# Patient Record
Sex: Female | Born: 1970 | Hispanic: Yes | Marital: Single | Smoking: Never smoker
Health system: Southern US, Community
[De-identification: ages and names within clinical notes are randomized; demographics above are authoritative.]

## PROBLEM LIST (undated history)

## (undated) DIAGNOSIS — J45909 Unspecified asthma, uncomplicated: Secondary | ICD-10-CM

## (undated) DIAGNOSIS — G43909 Migraine, unspecified, not intractable, without status migrainosus: Secondary | ICD-10-CM

## (undated) DIAGNOSIS — N390 Urinary tract infection, site not specified: Secondary | ICD-10-CM

## (undated) DIAGNOSIS — T7840XA Allergy, unspecified, initial encounter: Secondary | ICD-10-CM

## (undated) DIAGNOSIS — R519 Headache, unspecified: Secondary | ICD-10-CM

## (undated) DIAGNOSIS — F32A Depression, unspecified: Secondary | ICD-10-CM

## (undated) DIAGNOSIS — Z8619 Personal history of other infectious and parasitic diseases: Secondary | ICD-10-CM

## (undated) DIAGNOSIS — R51 Headache: Secondary | ICD-10-CM

## (undated) DIAGNOSIS — F329 Major depressive disorder, single episode, unspecified: Secondary | ICD-10-CM

## (undated) HISTORY — DX: Depression, unspecified: F32.A

## (undated) HISTORY — DX: Headache, unspecified: R51.9

## (undated) HISTORY — DX: Headache: R51

## (undated) HISTORY — DX: Urinary tract infection, site not specified: N39.0

## (undated) HISTORY — DX: Unspecified asthma, uncomplicated: J45.909

## (undated) HISTORY — DX: Major depressive disorder, single episode, unspecified: F32.9

## (undated) HISTORY — DX: Allergy, unspecified, initial encounter: T78.40XA

## (undated) HISTORY — DX: Migraine, unspecified, not intractable, without status migrainosus: G43.909

## (undated) HISTORY — DX: Personal history of other infectious and parasitic diseases: Z86.19

---

## 1990-11-29 HISTORY — PX: WISDOM TOOTH EXTRACTION: SHX21

## 2005-09-29 ENCOUNTER — Encounter: Admission: RE | Admit: 2005-09-29 | Discharge: 2005-09-29 | Payer: Self-pay | Admitting: Family Medicine

## 2007-03-31 ENCOUNTER — Encounter: Admission: RE | Admit: 2007-03-31 | Discharge: 2007-03-31 | Payer: Self-pay | Admitting: Obstetrics & Gynecology

## 2012-03-01 ENCOUNTER — Other Ambulatory Visit: Payer: Self-pay | Admitting: *Deleted

## 2012-03-01 DIAGNOSIS — Z1231 Encounter for screening mammogram for malignant neoplasm of breast: Secondary | ICD-10-CM

## 2012-03-06 ENCOUNTER — Ambulatory Visit
Admission: RE | Admit: 2012-03-06 | Discharge: 2012-03-06 | Disposition: A | Discharge status: Home / Self Care | Payer: BC Managed Care – PPO | Source: Ambulatory Visit | Attending: *Deleted | Admitting: *Deleted

## 2012-03-06 DIAGNOSIS — Z1231 Encounter for screening mammogram for malignant neoplasm of breast: Secondary | ICD-10-CM

## 2013-05-18 ENCOUNTER — Other Ambulatory Visit: Payer: Self-pay

## 2013-05-18 DIAGNOSIS — Z1231 Encounter for screening mammogram for malignant neoplasm of breast: Secondary | ICD-10-CM

## 2013-05-23 ENCOUNTER — Ambulatory Visit
Admission: RE | Admit: 2013-05-23 | Discharge: 2013-05-23 | Disposition: A | Discharge status: Home / Self Care | Payer: BC Managed Care – PPO | Source: Ambulatory Visit

## 2013-05-23 DIAGNOSIS — Z1231 Encounter for screening mammogram for malignant neoplasm of breast: Secondary | ICD-10-CM

## 2014-05-07 ENCOUNTER — Other Ambulatory Visit: Payer: Self-pay

## 2014-05-07 DIAGNOSIS — Z1231 Encounter for screening mammogram for malignant neoplasm of breast: Secondary | ICD-10-CM

## 2014-05-28 ENCOUNTER — Ambulatory Visit
Admission: RE | Admit: 2014-05-28 | Discharge: 2014-05-28 | Disposition: A | Discharge status: Home / Self Care | Payer: BC Managed Care – PPO | Source: Ambulatory Visit

## 2014-05-28 ENCOUNTER — Encounter (INDEPENDENT_AMBULATORY_CARE_PROVIDER_SITE_OTHER): Payer: Self-pay

## 2014-05-28 DIAGNOSIS — Z1231 Encounter for screening mammogram for malignant neoplasm of breast: Secondary | ICD-10-CM

## 2015-05-16 ENCOUNTER — Other Ambulatory Visit: Payer: Self-pay

## 2015-05-16 DIAGNOSIS — Z1231 Encounter for screening mammogram for malignant neoplasm of breast: Secondary | ICD-10-CM

## 2015-06-03 ENCOUNTER — Ambulatory Visit: Payer: BC Managed Care – PPO

## 2015-06-03 ENCOUNTER — Ambulatory Visit
Admission: RE | Admit: 2015-06-03 | Discharge: 2015-06-03 | Disposition: A | Discharge status: Home / Self Care | Payer: BLUE CROSS/BLUE SHIELD | Source: Ambulatory Visit

## 2015-06-03 DIAGNOSIS — Z1231 Encounter for screening mammogram for malignant neoplasm of breast: Secondary | ICD-10-CM

## 2015-09-22 ENCOUNTER — Other Ambulatory Visit (INDEPENDENT_AMBULATORY_CARE_PROVIDER_SITE_OTHER): Payer: 59

## 2015-09-22 ENCOUNTER — Ambulatory Visit (INDEPENDENT_AMBULATORY_CARE_PROVIDER_SITE_OTHER): Payer: 59 | Admitting: Internal Medicine

## 2015-09-22 ENCOUNTER — Encounter: Payer: Self-pay | Admitting: Internal Medicine

## 2015-09-22 VITALS — BP 120/80 | HR 82 | Temp 98.3°F | Resp 16 | Ht 65.5 in | Wt 183.0 lb

## 2015-09-22 DIAGNOSIS — J4520 Mild intermittent asthma, uncomplicated: Secondary | ICD-10-CM | POA: Diagnosis not present

## 2015-09-22 DIAGNOSIS — J45909 Unspecified asthma, uncomplicated: Secondary | ICD-10-CM | POA: Insufficient documentation

## 2015-09-22 DIAGNOSIS — Z0000 Encounter for general adult medical examination without abnormal findings: Secondary | ICD-10-CM | POA: Diagnosis not present

## 2015-09-22 LAB — LIPID PANEL
CHOL/HDL RATIO: 5
Cholesterol: 185 mg/dL (ref 0–200)
HDL: 38.8 mg/dL — ABNORMAL LOW (ref >39.00)
LDL CALC: 112 mg/dL — AB (ref 0–99)
NonHDL: 145.88
Triglycerides: 168 mg/dL — ABNORMAL HIGH (ref 0.0–149.0)
VLDL: 33.6 mg/dL (ref 0.0–40.0)

## 2015-09-22 LAB — COMPREHENSIVE METABOLIC PANEL
ALT: 18 U/L (ref 0–35)
AST: 11 U/L (ref 0–37)
Albumin: 4.2 g/dL (ref 3.5–5.2)
Alkaline Phosphatase: 78 U/L (ref 39–117)
BUN: 20 mg/dL (ref 6–23)
CO2: 27 mEq/L (ref 19–32)
CREATININE: 1.04 mg/dL (ref 0.40–1.20)
Calcium: 9.3 mg/dL (ref 8.4–10.5)
Chloride: 105 mEq/L (ref 96–112)
GFR: 61.14 mL/min (ref >60.00)
GLUCOSE: 107 mg/dL — AB (ref 70–99)
Potassium: 4.4 mEq/L (ref 3.5–5.1)
Sodium: 139 mEq/L (ref 135–145)
Total Bilirubin: 0.3 mg/dL (ref 0.2–1.2)
Total Protein: 7.4 g/dL (ref 6.0–8.3)

## 2015-09-22 LAB — HEMOGLOBIN A1C: HEMOGLOBIN A1C: 5.6 % (ref 4.6–6.5)

## 2015-09-22 MED ORDER — GUAIFENESIN 200 MG/5ML PO LIQD: 5.0000 mL | Freq: Four times a day (QID) | ORAL | Status: DC | PRN

## 2015-09-22 MED ORDER — FLUTICASONE-SALMETEROL 100-50 MCG/DOSE IN AEPB: 1.0000 | INHALATION_SPRAY | Freq: Two times a day (BID) | RESPIRATORY_TRACT | Status: DC

## 2015-09-22 MED ORDER — ALBUTEROL SULFATE HFA 108 (90 BASE) MCG/ACT IN AERS: 1.0000 | INHALATION_SPRAY | Freq: Four times a day (QID) | RESPIRATORY_TRACT | Status: DC | PRN

## 2015-09-22 NOTE — Progress Notes (Signed)
Pre visit review using our clinic review tool, if applicable. No additional management support is needed unless otherwise documented below in the visit note. 

## 2015-09-22 NOTE — Progress Notes (Signed)
   Subjective:    Patient ID: Amy Romero, female    DOB: [birth year], 44 y.o.   MRN: [id number]  HPI The patient is a new 44 YO female coming in for asthma. She has not had any flares in the last year. She does not use her medication appropriately and sometimes takes her advair daily or a couple times per week to stretch out her medication. She denies ever intubation or prednisone course in the last 5 years. Worse in the winter and allergy season. She does not take anything for allergies.   [facility], social history reviewed and updated.   Review of Systems  Constitutional: Negative for fever, activity change, appetite change, fatigue and unexpected weight change.  HENT: Negative.   Eyes: Negative.   Respiratory: Negative for cough, chest tightness, shortness of breath and wheezing.   Cardiovascular: Negative for chest pain, palpitations and leg swelling.  Gastrointestinal: Negative for nausea, abdominal pain, diarrhea, constipation and abdominal distention.  Musculoskeletal: Negative.   Skin: Negative.   Neurological: Negative.   Psychiatric/Behavioral: Negative.       Objective:   Physical Exam  Constitutional: She is oriented to person, place, and time. She appears well-developed and well-nourished.  HENT:  Head: Normocephalic and atraumatic.  Eyes: EOM are normal.  Neck: Normal range of motion.  Cardiovascular: Normal rate and regular rhythm.   Pulmonary/Chest: Effort normal and breath sounds normal. No respiratory distress. She has no wheezes. She has no rales.  Abdominal: Soft. She exhibits no distension. There is no tenderness. There is no rebound.  Musculoskeletal: She exhibits no edema.  Neurological: She is alert and oriented to person, place, and time. Coordination normal.  Skin: Skin is warm and dry.  Psychiatric: She has a normal mood and affect.   Filed Vitals:   09/22/15 0915  BP: 120/80  Pulse: 82  Temp: 98.3 F (36.8 C)  TempSrc: Oral  Resp: 16    Height: 5' 5.5" (1.664 m)  Weight: 183 lb (83.008 kg)  SpO2: 98%      Assessment & Plan:

## 2015-09-22 NOTE — Patient Instructions (Signed)
We will check the blood work today and have sent in your refills.   Come back in about 1 year or sooner if you have new problems or questions.   Health Maintenance, Female Adopting a healthy lifestyle and getting preventive care can go a long way to promote health and wellness. Talk with your health care provider about what schedule of regular examinations is right for you. This is a good chance for you to check in with your provider about disease prevention and staying healthy. In between checkups, there are plenty of things you can do on your own. Experts have done a lot of research about which lifestyle changes and preventive measures are most likely to keep you healthy. Ask your health care provider for more information. WEIGHT AND DIET  Eat a healthy diet  Be sure to include plenty of vegetables, fruits, low-fat dairy products, and lean protein.  Do not eat a lot of foods high in solid fats, added sugars, or salt.  Get regular exercise. This is one of the most important things you can do for your health.  Most adults should exercise for at least 150 minutes each week. The exercise should increase your heart rate and make you sweat (moderate-intensity exercise).  Most adults should also do strengthening exercises at least twice a week. This is in addition to the moderate-intensity exercise.  Maintain a healthy weight  Body mass index (BMI) is a measurement that can be used to identify possible weight problems. It estimates body fat based on height and weight. Your health care provider can help determine your BMI and help you achieve or maintain a healthy weight.  For females 69 years of age and older:   A BMI below 18.5 is considered underweight.  A BMI of 18.5 to 24.9 is normal.  A BMI of 25 to 29.9 is considered overweight.  A BMI of 30 and above is considered obese.  Watch levels of cholesterol and blood lipids  You should start having your blood tested for lipids and  cholesterol at 44 years of age, then have this test every 5 years.  You may need to have your cholesterol levels checked more often if:  Your lipid or cholesterol levels are high.  You are older than 44 years of age.  You are at high risk for heart disease.  CANCER SCREENING   Lung Cancer  Lung cancer screening is recommended for adults 54-56 years old who are at high risk for lung cancer because of a history of smoking.  A yearly low-dose CT scan of the lungs is recommended for people who:  Currently smoke.  Have quit within the past 15 years.  Have at least a 30-pack-year history of smoking. A pack year is smoking an average of one pack of cigarettes a day for 1 year.  Yearly screening should continue until it has been 15 years since you quit.  Yearly screening should stop if you develop a health problem that would prevent you from having lung cancer treatment.  Breast Cancer  Practice breast self-awareness. This means understanding how your breasts normally appear and feel.  It also means doing regular breast self-exams. Let your health care provider know about any changes, no matter how small.  If you are in your 20s or 30s, you should have a clinical breast exam (CBE) by a health care provider every 1-3 years as part of a regular health exam.  If you are 35 or older, have a CBE every  having a breast X-ray (mammogram) every year.  If you have a family history of breast cancer, talk to your health care provider about genetic screening.  If you are at high risk for breast cancer, talk to your health care provider about having an MRI and a mammogram every year.  Breast cancer gene (BRCA) assessment is recommended for women who have family members with BRCA-related cancers. BRCA-related cancers include:  Breast.  Ovarian.  Tubal.  Peritoneal cancers.  Results of the assessment will determine the need for genetic counseling and BRCA1 and BRCA2  testing. Cervical Cancer Your health care provider may recommend that you be screened regularly for cancer of the pelvic organs (ovaries, uterus, and vagina). This screening involves a pelvic examination, including checking for microscopic changes to the surface of your cervix (Pap test). You may be encouraged to have this screening done every 3 years, beginning at age 21.  For women ages 30-65, health care providers may recommend pelvic exams and Pap testing every 3 years, or they may recommend the Pap and pelvic exam, combined with testing for human papilloma virus (HPV), every 5 years. Some types of HPV increase your risk of cervical cancer. Testing for HPV may also be done on women of any age with unclear Pap test results.  Other health care providers may not recommend any screening for nonpregnant women who are considered low risk for pelvic cancer and who do not have symptoms. Ask your health care provider if a screening pelvic exam is right for you.  If you have had past treatment for cervical cancer or a condition that could lead to cancer, you need Pap tests and screening for cancer for at least 20 years after your treatment. If Pap tests have been discontinued, your risk factors (such as having a new sexual partner) need to be reassessed to determine if screening should resume. Some women have medical problems that increase the chance of getting cervical cancer. In these cases, your health care provider may recommend more frequent screening and Pap tests. Colorectal Cancer  This type of cancer can be detected and often prevented.  Routine colorectal cancer screening usually begins at 44 years of age and continues through 44 years of age.  Your health care provider may recommend screening at an earlier age if you have risk factors for colon cancer.  Your health care provider may also recommend using home test kits to check for hidden blood in the stool.  A small camera at the end of a  tube can be used to examine your colon directly (sigmoidoscopy or colonoscopy). This is done to check for the earliest forms of colorectal cancer.  Routine screening usually begins at age 50.  Direct examination of the colon should be repeated every 5-10 years through 44 years of age. However, you may need to be screened more often if early forms of precancerous polyps or small growths are found. Skin Cancer  Check your skin from head to toe regularly.  Tell your health care provider about any new moles or changes in moles, especially if there is a change in a mole's shape or color.  Also tell your health care provider if you have a mole that is larger than the size of a pencil eraser.  Always use sunscreen. Apply sunscreen liberally and repeatedly throughout the day.  Protect yourself by wearing long sleeves, pants, a wide-brimmed hat, and sunglasses whenever you are outside. HEART DISEASE, DIABETES, AND HIGH BLOOD PRESSURE   High blood   High blood pressure causes heart disease and increases the risk of stroke. High blood pressure is more likely to develop in:  People who have blood pressure in the high end of the normal range (130-139/85-89 mm Hg).  People who are overweight or obese.  People who are African American.  If you are 18-39 years of age, have your blood pressure checked every 3-5 years. If you are 40 years of age or older, have your blood pressure checked every year. You should have your blood pressure measured twice--once when you are at a hospital or clinic, and once when you are not at a hospital or clinic. Record the average of the two measurements. To check your blood pressure when you are not at a hospital or clinic, you can use:  An automated blood pressure machine at a pharmacy.  A home blood pressure monitor.  If you are between 55 years and 79 years old, ask your health care provider if you should take aspirin to prevent strokes.  Have regular diabetes screenings. This  involves taking a blood sample to check your fasting blood sugar level.  If you are at a normal weight and have a low risk for diabetes, have this test once every three years after 45 years of age.  If you are overweight and have a high risk for diabetes, consider being tested at a younger age or more often. PREVENTING INFECTION  Hepatitis B  If you have a higher risk for hepatitis B, you should be screened for this virus. You are considered at high risk for hepatitis B if:  You were born in a country where hepatitis B is common. Ask your health care provider which countries are considered high risk.  Your parents were born in a high-risk country, and you have not been immunized against hepatitis B (hepatitis B vaccine).  You have HIV or AIDS.  You use needles to inject street drugs.  You live with someone who has hepatitis B.  You have had sex with someone who has hepatitis B.  You get hemodialysis treatment.  You take certain medicines for conditions, including cancer, organ transplantation, and autoimmune conditions. Hepatitis C  Blood testing is recommended for:  Everyone born from 1945 through 1965.  Anyone with known risk factors for hepatitis C. Sexually transmitted infections (STIs)  You should be screened for sexually transmitted infections (STIs) including gonorrhea and chlamydia if:  You are sexually active and are younger than 44 years of age.  You are older than 44 years of age and your health care provider tells you that you are at risk for this type of infection.  Your sexual activity has changed since you were last screened and you are at an increased risk for chlamydia or gonorrhea. Ask your health care provider if you are at risk.  If you do not have HIV, but are at risk, it may be recommended that you take a prescription medicine daily to prevent HIV infection. This is called pre-exposure prophylaxis (PrEP). You are considered at risk if:  You are  sexually active and do not regularly use condoms or know the HIV status of your partner(s).  You take drugs by injection.  You are sexually active with a partner who has HIV. Talk with your health care provider about whether you are at high risk of being infected with HIV. If you choose to begin PrEP, you should first be tested for HIV. You should then be tested every 3 months for as   long as you are taking PrEP.  PREGNANCY   If you are premenopausal and you may become pregnant, ask your health care provider about preconception counseling.  If you may become pregnant, take 400 to 800 micrograms (mcg) of folic acid every day.  If you want to prevent pregnancy, talk to your health care provider about birth control (contraception). OSTEOPOROSIS AND MENOPAUSE   Osteoporosis is a disease in which the bones lose minerals and strength with aging. This can result in serious bone fractures. Your risk for osteoporosis can be identified using a bone density scan.  If you are 27 years of age or older, or if you are at risk for osteoporosis and fractures, ask your health care provider if you should be screened.  Ask your health care provider whether you should take a calcium or vitamin D supplement to lower your risk for osteoporosis.  Menopause may have certain physical symptoms and risks.  Hormone replacement therapy may reduce some of these symptoms and risks. Talk to your health care provider about whether hormone replacement therapy is right for you.  HOME CARE INSTRUCTIONS   Schedule regular health, dental, and eye exams.  Stay current with your immunizations.   Do not use any tobacco products including cigarettes, chewing tobacco, or electronic cigarettes.  If you are pregnant, do not drink alcohol.  If you are breastfeeding, limit how much and how often you drink alcohol.  Limit alcohol intake to no more than 1 drink per day for nonpregnant women. One drink equals 12 ounces of beer, 5  ounces of wine, or 1 ounces of hard liquor.  Do not use street drugs.  Do not share needles.  Ask your health care provider for help if you need support or information about quitting drugs.  Tell your health care provider if you often feel depressed.  Tell your health care provider if you have ever been abused or do not feel safe at home.   This information is not intended to replace advice given to you by your health care provider. Make sure you discuss any questions you have with your health care provider.   Document Released: 05/31/2011 Document Revised: 12/06/2014 Document Reviewed: 10/17/2013 Elsevier Interactive Patient Education [employer].

## 2015-09-22 NOTE — Assessment & Plan Note (Signed)
Refill of advair and talked to her about the difference between maintenance and rescue. Refilled her proventil as well. No flare today and talked to her about allergy medication to help with her breathing during her worst seasons. No recent flares.

## 2015-10-07 ENCOUNTER — Telehealth: Payer: Self-pay | Admitting: Internal Medicine

## 2015-10-07 NOTE — Telephone Encounter (Signed)
[facility]

## 2015-11-10 ENCOUNTER — Telehealth: Payer: Self-pay

## 2015-11-10 MED ORDER — BENZONATATE 200 MG PO CAPS: 200.0000 mg | ORAL_CAPSULE | Freq: Three times a day (TID) | ORAL | Status: DC | PRN

## 2015-11-10 NOTE — Telephone Encounter (Signed)
Sent in the tessalon perles for her.

## 2015-11-10 NOTE — Telephone Encounter (Signed)
Patient states she is not taking guafenisin-----and would like to have benzonatate (Tassalon) called in for coughs she has periodically in the winter months due to asthma exacerbations---benzonatate is not currently showing on patients med list-----please advise, i will call patient back, thanks

## 2015-11-10 NOTE — Addendum Note (Signed)
Addended by: Hillard DankerRAWFORD, Adlai Nieblas A on: 11/10/2015 04:21 PM   Modules accepted: Orders

## 2015-11-11 NOTE — Telephone Encounter (Signed)
Advised patient that tessalon has been sent to [facility]

## 2016-06-08 ENCOUNTER — Other Ambulatory Visit: Payer: Self-pay | Admitting: Obstetrics & Gynecology

## 2016-06-08 DIAGNOSIS — Z1231 Encounter for screening mammogram for malignant neoplasm of breast: Secondary | ICD-10-CM

## 2016-06-17 ENCOUNTER — Ambulatory Visit
Admission: RE | Admit: 2016-06-17 | Discharge: 2016-06-17 | Disposition: A | Discharge status: Home / Self Care | Payer: 59 | Source: Ambulatory Visit | Attending: Obstetrics & Gynecology | Admitting: Obstetrics & Gynecology

## 2016-06-17 DIAGNOSIS — Z1231 Encounter for screening mammogram for malignant neoplasm of breast: Secondary | ICD-10-CM

## 2016-09-23 ENCOUNTER — Encounter: Payer: Self-pay | Admitting: Internal Medicine

## 2016-09-23 ENCOUNTER — Other Ambulatory Visit (INDEPENDENT_AMBULATORY_CARE_PROVIDER_SITE_OTHER): Payer: 59

## 2016-09-23 ENCOUNTER — Ambulatory Visit (INDEPENDENT_AMBULATORY_CARE_PROVIDER_SITE_OTHER): Payer: 59 | Admitting: Internal Medicine

## 2016-09-23 VITALS — BP 134/74 | HR 85 | Temp 99.1°F | Resp 14 | Ht 64.5 in | Wt 174.8 lb

## 2016-09-23 DIAGNOSIS — Z0000 Encounter for general adult medical examination without abnormal findings: Secondary | ICD-10-CM | POA: Diagnosis not present

## 2016-09-23 DIAGNOSIS — J4520 Mild intermittent asthma, uncomplicated: Secondary | ICD-10-CM

## 2016-09-23 LAB — COMPREHENSIVE METABOLIC PANEL
ALT: 13 U/L (ref 0–35)
AST: 9 U/L (ref 0–37)
Albumin: 4.3 g/dL (ref 3.5–5.2)
Alkaline Phosphatase: 70 U/L (ref 39–117)
BUN: 12 mg/dL (ref 6–23)
CO2: 26 mEq/L (ref 19–32)
Calcium: 9.2 mg/dL (ref 8.4–10.5)
Chloride: 105 mEq/L (ref 96–112)
Creatinine, Ser: 0.84 mg/dL (ref 0.40–1.20)
GFR: 77.87 mL/min (ref >60.00)
Glucose, Bld: 100 mg/dL — ABNORMAL HIGH (ref 70–99)
Potassium: 4 mEq/L (ref 3.5–5.1)
Sodium: 138 mEq/L (ref 135–145)
Total Bilirubin: 0.5 mg/dL (ref 0.2–1.2)
Total Protein: 7.3 g/dL (ref 6.0–8.3)

## 2016-09-23 LAB — CBC
HCT: 40.5 % (ref 36.0–46.0)
Hemoglobin: 13.9 g/dL (ref 12.0–15.0)
MCHC: 34.4 g/dL (ref 30.0–36.0)
MCV: 85.8 fl (ref 78.0–100.0)
Platelets: 231 10*3/uL (ref 150.0–400.0)
RBC: 4.72 Mil/uL (ref 3.87–5.11)
RDW: 12.8 % (ref 11.5–15.5)
WBC: 7.5 10*3/uL (ref 4.0–10.5)

## 2016-09-23 LAB — LIPID PANEL
Cholesterol: 196 mg/dL (ref 0–200)
HDL: 39.4 mg/dL (ref >39.00)
LDL Cholesterol: 127 mg/dL — ABNORMAL HIGH (ref 0–99)
NonHDL: 156.65
Total CHOL/HDL Ratio: 5
Triglycerides: 150 mg/dL — ABNORMAL HIGH (ref 0.0–149.0)
VLDL: 30 mg/dL (ref 0.0–40.0)

## 2016-09-23 MED ORDER — FLUTICASONE-SALMETEROL 100-50 MCG/DOSE IN AEPB: 1.0000 | INHALATION_SPRAY | Freq: Two times a day (BID) | RESPIRATORY_TRACT | 11 refills | Status: DC

## 2016-09-23 MED ORDER — ALBUTEROL SULFATE HFA 108 (90 BASE) MCG/ACT IN AERS: 1.0000 | INHALATION_SPRAY | Freq: Four times a day (QID) | RESPIRATORY_TRACT | 3 refills | Status: DC | PRN

## 2016-09-23 NOTE — Progress Notes (Signed)
Pre visit review using our clinic review tool, if applicable. No additional management support is needed unless otherwise documented below in the visit note. 

## 2016-09-23 NOTE — Assessment & Plan Note (Signed)
Declines flu shot, pap smear up to date with gyn. Tetanus up to date. Counseled about the need for regular exercise and sun safety as well as the dangers of distracted driving. Given screening recommendations.

## 2016-09-23 NOTE — Patient Instructions (Signed)
We have sent in the refills of the advair and the albuterol for you.  If you ever run out in the future you can always call for refills.   Work on doing some exercise 2-3 times per week for 30 minutes.   Health Maintenance, Female Adopting a healthy lifestyle and getting preventive care can go a long way to promote health and wellness. Talk with your health care provider about what schedule of regular examinations is right for you. This is a good chance for you to check in with your provider about disease prevention and staying healthy. In between checkups, there are plenty of things you can do on your own. Experts have done a lot of research about which lifestyle changes and preventive measures are most likely to keep you healthy. Ask your health care provider for more information. WEIGHT AND DIET  Eat a healthy diet  Be sure to include plenty of vegetables, fruits, low-fat dairy products, and lean protein.  Do not eat a lot of foods high in solid fats, added sugars, or salt.  Get regular exercise. This is one of the most important things you can do for your health.  Most adults should exercise for at least 150 minutes each week. The exercise should increase your heart rate and make you sweat (moderate-intensity exercise).  Most adults should also do strengthening exercises at least twice a week. This is in addition to the moderate-intensity exercise.  Maintain a healthy weight  Body mass index (BMI) is a measurement that can be used to identify possible weight problems. It estimates body fat based on height and weight. Your health care provider can help determine your BMI and help you achieve or maintain a healthy weight.  For females 16 years of age and older:   A BMI below 18.5 is considered underweight.  A BMI of 18.5 to 24.9 is normal.  A BMI of 25 to 29.9 is considered overweight.  A BMI of 30 and above is considered obese.  Watch levels of cholesterol and blood  lipids  You should start having your blood tested for lipids and cholesterol at 45 years of age, then have this test every 5 years.  You may need to have your cholesterol levels checked more often if:  Your lipid or cholesterol levels are high.  You are older than 45 years of age.  You are at high risk for heart disease.  CANCER SCREENING   Lung Cancer  Lung cancer screening is recommended for adults 40-49 years old who are at high risk for lung cancer because of a history of smoking.  A yearly low-dose CT scan of the lungs is recommended for people who:  Currently smoke.  Have quit within the past 15 years.  Have at least a 30-pack-year history of smoking. A pack year is smoking an average of one pack of cigarettes a day for 1 year.  Yearly screening should continue until it has been 15 years since you quit.  Yearly screening should stop if you develop a health problem that would prevent you from having lung cancer treatment.  Breast Cancer  Practice breast self-awareness. This means understanding how your breasts normally appear and feel.  It also means doing regular breast self-exams. Let your health care provider know about any changes, no matter how small.  If you are in your 20s or 30s, you should have a clinical breast exam (CBE) by a health care provider every 1-3 years as part of a regular  health exam.  If you are 40 or older, have a CBE every year. Also consider having a breast X-ray (mammogram) every year.  If you have a family history of breast cancer, talk to your health care provider about genetic screening.  If you are at high risk for breast cancer, talk to your health care provider about having an MRI and a mammogram every year.  Breast cancer gene (BRCA) assessment is recommended for women who have family members with BRCA-related cancers. BRCA-related cancers include:  Breast.  Ovarian.  Tubal.  Peritoneal cancers.  Results of the assessment  will determine the need for genetic counseling and BRCA1 and BRCA2 testing. Cervical Cancer Your health care provider may recommend that you be screened regularly for cancer of the pelvic organs (ovaries, uterus, and vagina). This screening involves a pelvic examination, including checking for microscopic changes to the surface of your cervix (Pap test). You may be encouraged to have this screening done every 3 years, beginning at age 13.  For women ages 66-65, health care providers may recommend pelvic exams and Pap testing every 3 years, or they may recommend the Pap and pelvic exam, combined with testing for human papilloma virus (HPV), every 5 years. Some types of HPV increase your risk of cervical cancer. Testing for HPV may also be done on women of any age with unclear Pap test results.  Other health care providers may not recommend any screening for nonpregnant women who are considered low risk for pelvic cancer and who do not have symptoms. Ask your health care provider if a screening pelvic exam is right for you.  If you have had past treatment for cervical cancer or a condition that could lead to cancer, you need Pap tests and screening for cancer for at least 20 years after your treatment. If Pap tests have been discontinued, your risk factors (such as having a new sexual partner) need to be reassessed to determine if screening should resume. Some women have medical problems that increase the chance of getting cervical cancer. In these cases, your health care provider may recommend more frequent screening and Pap tests. Colorectal Cancer  This type of cancer can be detected and often prevented.  Routine colorectal cancer screening usually begins at 45 years of age and continues through 45 years of age.  Your health care provider may recommend screening at an earlier age if you have risk factors for colon cancer.  Your health care provider may also recommend using home test kits to check  for hidden blood in the stool.  A small camera at the end of a tube can be used to examine your colon directly (sigmoidoscopy or colonoscopy). This is done to check for the earliest forms of colorectal cancer.  Routine screening usually begins at age 72.  Direct examination of the colon should be repeated every 5-10 years through 45 years of age. However, you may need to be screened more often if early forms of precancerous polyps or small growths are found. Skin Cancer  Check your skin from head to toe regularly.  Tell your health care provider about any new moles or changes in moles, especially if there is a change in a mole's shape or color.  Also tell your health care provider if you have a mole that is larger than the size of a pencil eraser.  Always use sunscreen. Apply sunscreen liberally and repeatedly throughout the day.  Protect yourself by wearing long sleeves, pants, a wide-brimmed hat, and  sunglasses whenever you are outside. HEART DISEASE, DIABETES, AND HIGH BLOOD PRESSURE   High blood pressure causes heart disease and increases the risk of stroke. High blood pressure is more likely to develop in:  People who have blood pressure in the high end of the normal range (130-139/85-89 mm Hg).  People who are overweight or obese.  People who are African American.  If you are 13-40 years of age, have your blood pressure checked every 3-5 years. If you are 65 years of age or older, have your blood pressure checked every year. You should have your blood pressure measured twice--once when you are at a hospital or clinic, and once when you are not at a hospital or clinic. Record the average of the two measurements. To check your blood pressure when you are not at a hospital or clinic, you can use:  An automated blood pressure machine at a pharmacy.  A home blood pressure monitor.  If you are between 55 years and 85 years old, ask your health care provider if you should take  aspirin to prevent strokes.  Have regular diabetes screenings. This involves taking a blood sample to check your fasting blood sugar level.  If you are at a normal weight and have a low risk for diabetes, have this test once every three years after 45 years of age.  If you are overweight and have a high risk for diabetes, consider being tested at a younger age or more often. PREVENTING INFECTION  Hepatitis B  If you have a higher risk for hepatitis B, you should be screened for this virus. You are considered at high risk for hepatitis B if:  You were born in a country where hepatitis B is common. Ask your health care provider which countries are considered high risk.  Your parents were born in a high-risk country, and you have not been immunized against hepatitis B (hepatitis B vaccine).  You have HIV or AIDS.  You use needles to inject street drugs.  You live with someone who has hepatitis B.  You have had sex with someone who has hepatitis B.  You get hemodialysis treatment.  You take certain medicines for conditions, including cancer, organ transplantation, and autoimmune conditions. Hepatitis C  Blood testing is recommended for:  Everyone born from [birth year] through 1965.  Anyone with known risk factors for hepatitis C. Sexually transmitted infections (STIs)  You should be screened for sexually transmitted infections (STIs) including gonorrhea and chlamydia if:  You are sexually active and are younger than 44 years of age.  You are older than 45 years of age and your health care provider tells you that you are at risk for this type of infection.  Your sexual activity has changed since you were last screened and you are at an increased risk for chlamydia or gonorrhea. Ask your health care provider if you are at risk.  If you do not have HIV, but are at risk, it may be recommended that you take a prescription medicine daily to prevent HIV infection. This is called  pre-exposure prophylaxis (PrEP). You are considered at risk if:  You are sexually active and do not regularly use condoms or know the HIV status of your partner(s).  You take drugs by injection.  You are sexually active with a partner who has HIV. Talk with your health care provider about whether you are at high risk of being infected with HIV. If you choose to begin PrEP, you should first  be tested for HIV. You should then be tested every 3 months for as long as you are taking PrEP.  PREGNANCY   If you are premenopausal and you may become pregnant, ask your health care provider about preconception counseling.  If you may become pregnant, take 400 to 800 micrograms (mcg) of folic acid every day.  If you want to prevent pregnancy, talk to your health care provider about birth control (contraception). OSTEOPOROSIS AND MENOPAUSE   Osteoporosis is a disease in which the bones lose minerals and strength with aging. This can result in serious bone fractures. Your risk for osteoporosis can be identified using a bone density scan.  If you are 42 years of age or older, or if you are at risk for osteoporosis and fractures, ask your health care provider if you should be screened.  Ask your health care provider whether you should take a calcium or vitamin D supplement to lower your risk for osteoporosis.  Menopause may have certain physical symptoms and risks.  Hormone replacement therapy may reduce some of these symptoms and risks. Talk to your health care provider about whether hormone replacement therapy is right for you.  HOME CARE INSTRUCTIONS   Schedule regular health, dental, and eye exams.  Stay current with your immunizations.   Do not use any tobacco products including cigarettes, chewing tobacco, or electronic cigarettes.  If you are pregnant, do not drink alcohol.  If you are breastfeeding, limit how much and how often you drink alcohol.  Limit alcohol intake to no more than 1  drink per day for nonpregnant women. One drink equals 12 ounces of beer, 5 ounces of wine, or 1 ounces of hard liquor.  Do not use street drugs.  Do not share needles.  Ask your health care provider for help if you need support or information about quitting drugs.  Tell your health care provider if you often feel depressed.  Tell your health care provider if you have ever been abused or do not feel safe at home.   This information is not intended to replace advice given to you by your health care provider. Make sure you discuss any questions you have with your health care provider.   Document Released: 05/31/2011 Document Revised: 12/06/2014 Document Reviewed: 10/17/2013 Elsevier Interactive Patient Education [employer].

## 2016-09-23 NOTE — Progress Notes (Signed)
   Subjective:    Patient ID: Amy Romero, female    DOB: 1971/11/24, 45 y.o.   MRN: [id number]  HPI The patient is a 45 YO female coming in for wellness. No new concerns. Asthma doing well, no flares since last time.   [facility], [facility] reviewed and updated.   Review of Systems  Constitutional: Negative.   HENT: Negative.   Eyes: Negative.   Respiratory: Negative for cough, chest tightness, shortness of breath and wheezing.   Cardiovascular: Negative.   Gastrointestinal: Negative.   Musculoskeletal: Negative.   Skin: Negative.   Neurological: Negative.   Psychiatric/Behavioral: Negative.       Objective:   Physical Exam  Constitutional: She is oriented to person, place, and time. She appears well-developed and well-nourished.  HENT:  Head: Normocephalic and atraumatic.  Eyes: EOM are normal.  Neck: Normal range of motion.  Cardiovascular: Normal rate and regular rhythm.   Pulmonary/Chest: Effort normal and breath sounds normal. No respiratory distress. She has no wheezes. She has no rales.  Abdominal: Soft. Bowel sounds are normal. She exhibits no distension. There is no tenderness. There is no rebound.  Musculoskeletal: She exhibits no edema.  Neurological: She is alert and oriented to person, place, and time. Coordination normal.  Skin: Skin is warm and dry.  Psychiatric: She has a normal mood and affect.   Vitals:   09/23/16 0938  BP: 134/74  Pulse: 85  Resp: 14  Temp: 99.1 F (37.3 C)  TempSrc: Oral  SpO2: 97%  Weight: 174 lb 12.8 oz (79.3 kg)  Height: 5' 4.5" (1.638 m)      Assessment & Plan:

## 2016-09-23 NOTE — Assessment & Plan Note (Signed)
Refill on advair and albuterol. No flare since last visit and no flare today. Mild intermittent.

## 2016-09-24 LAB — HIV ANTIBODY (ROUTINE TESTING W REFLEX): HIV: NONREACTIVE

## 2017-07-04 DIAGNOSIS — Z01419 Encounter for gynecological examination (general) (routine) without abnormal findings: Secondary | ICD-10-CM | POA: Diagnosis not present

## 2017-08-15 ENCOUNTER — Other Ambulatory Visit: Payer: Self-pay | Admitting: Internal Medicine

## 2017-09-26 ENCOUNTER — Ambulatory Visit (INDEPENDENT_AMBULATORY_CARE_PROVIDER_SITE_OTHER): Payer: 59 | Admitting: Internal Medicine

## 2017-09-26 ENCOUNTER — Encounter: Payer: Self-pay | Admitting: Internal Medicine

## 2017-09-26 ENCOUNTER — Other Ambulatory Visit (INDEPENDENT_AMBULATORY_CARE_PROVIDER_SITE_OTHER): Payer: 59

## 2017-09-26 VITALS — BP 118/80 | HR 70 | Temp 98.7°F | Ht 64.5 in | Wt 182.0 lb

## 2017-09-26 DIAGNOSIS — J4520 Mild intermittent asthma, uncomplicated: Secondary | ICD-10-CM

## 2017-09-26 DIAGNOSIS — K219 Gastro-esophageal reflux disease without esophagitis: Secondary | ICD-10-CM | POA: Insufficient documentation

## 2017-09-26 DIAGNOSIS — Z0000 Encounter for general adult medical examination without abnormal findings: Secondary | ICD-10-CM

## 2017-09-26 LAB — COMPREHENSIVE METABOLIC PANEL
ALT: 16 U/L (ref 0–35)
AST: 12 U/L (ref 0–37)
Albumin: 4.1 g/dL (ref 3.5–5.2)
Alkaline Phosphatase: 66 U/L (ref 39–117)
BUN: 10 mg/dL (ref 6–23)
CALCIUM: 9.1 mg/dL (ref 8.4–10.5)
CHLORIDE: 104 meq/L (ref 96–112)
CO2: 26 meq/L (ref 19–32)
CREATININE: 0.83 mg/dL (ref 0.40–1.20)
GFR: 78.6 mL/min (ref >60.00)
Glucose, Bld: 106 mg/dL — ABNORMAL HIGH (ref 70–99)
Potassium: 4 mEq/L (ref 3.5–5.1)
Sodium: 138 mEq/L (ref 135–145)
Total Bilirubin: 0.4 mg/dL (ref 0.2–1.2)
Total Protein: 7 g/dL (ref 6.0–8.3)

## 2017-09-26 LAB — LIPID PANEL
CHOL/HDL RATIO: 5
Cholesterol: 177 mg/dL (ref 0–200)
HDL: 35.9 mg/dL — ABNORMAL LOW (ref >39.00)
LDL CALC: 115 mg/dL — AB (ref 0–99)
NonHDL: 141.14
TRIGLYCERIDES: 133 mg/dL (ref 0.0–149.0)
VLDL: 26.6 mg/dL (ref 0.0–40.0)

## 2017-09-26 LAB — CBC
HEMATOCRIT: 41 % (ref 36.0–46.0)
HEMOGLOBIN: 13.8 g/dL (ref 12.0–15.0)
MCHC: 33.8 g/dL (ref 30.0–36.0)
MCV: 88.3 fl (ref 78.0–100.0)
PLATELETS: 225 10*3/uL (ref 150.0–400.0)
RBC: 4.64 Mil/uL (ref 3.87–5.11)
RDW: 13.2 % (ref 11.5–15.5)
WBC: 8.3 10*3/uL (ref 4.0–10.5)

## 2017-09-26 LAB — HEMOGLOBIN A1C: Hgb A1c MFr Bld: 5.6 % (ref 4.6–6.5)

## 2017-09-26 MED ORDER — FLUTICASONE-SALMETEROL 100-50 MCG/DOSE IN AEPB: 1.0000 | INHALATION_SPRAY | Freq: Two times a day (BID) | RESPIRATORY_TRACT | 11 refills | Status: DC

## 2017-09-26 MED ORDER — ALBUTEROL SULFATE HFA 108 (90 BASE) MCG/ACT IN AERS: INHALATION_SPRAY | RESPIRATORY_TRACT | 0 refills | Status: DC

## 2017-09-26 NOTE — Assessment & Plan Note (Signed)
Declines flu shot, pap and tetanus up to date. Counseled about sun safety and mole surveillance as well as dangers of distracted driving. Given screening recommendations.

## 2017-09-26 NOTE — Progress Notes (Signed)
   Subjective:    Patient ID: Amy Romero, female    DOB: June 21, 1971, 46 y.o.   MRN: [id number]  HPI The patient is a 46 YO female coming in for physical. Some more GERD which is causing some chest tightness  [facility], social history reviewed and updated.   Review of Systems  Constitutional: Negative.   HENT: Negative.   Eyes: Negative.   Respiratory: Positive for chest tightness. Negative for cough and shortness of breath.        Rare  Cardiovascular: Negative for chest pain, palpitations and leg swelling.  Gastrointestinal: Negative for abdominal distention, abdominal pain, constipation, diarrhea, nausea and vomiting.       Heartburn  Musculoskeletal: Negative.   Skin: Negative.   Neurological: Negative.   Psychiatric/Behavioral: Negative.       Objective:   Physical Exam  Constitutional: She is oriented to person, place, and time. She appears well-developed and well-nourished.  HENT:  Head: Normocephalic and atraumatic.  Eyes: EOM are normal.  Neck: Normal range of motion.  Cardiovascular: Normal rate and regular rhythm.   Pulmonary/Chest: Effort normal and breath sounds normal. No respiratory distress. She has no wheezes. She has no rales.  Abdominal: Soft. Bowel sounds are normal. She exhibits no distension. There is no tenderness. There is no rebound.  Musculoskeletal: She exhibits no edema.  Neurological: She is alert and oriented to person, place, and time. Coordination normal.  Skin: Skin is warm and dry.  Psychiatric: She has a normal mood and affect.   Vitals:   09/26/17 0857  BP: 118/80  Pulse: 70  Temp: 98.7 F (37.1 C)  TempSrc: Oral  SpO2: 99%  Weight: 182 lb (82.6 kg)  Height: 5' 4.5" (1.638 m)      Assessment & Plan:

## 2017-09-26 NOTE — Assessment & Plan Note (Signed)
Probably due to abdominal girth and we talked about exercise for toning. Advised pepcid or zantac night time to help prevent asthma flare.

## 2017-09-26 NOTE — Patient Instructions (Signed)
Think about getting into some exercise to help the lungs get stronger.This can also help with stress as well.   Think about trying the pepcid or zantac at night time for the heartburn to prevent asthma flares.    Stress and Stress Management Stress is a normal reaction to life events. It is what you feel when life demands more than you are used to or more than you can handle. Some stress can be useful. For example, the stress reaction can help you catch the last bus of the day, study for a test, or meet a deadline at work. But stress that occurs too often or for too long can cause problems. It can affect your emotional health and interfere with relationships and normal daily activities. Too much stress can weaken your immune system and increase your risk for physical illness. If you already have a medical problem, stress can make it worse. What are the causes? All sorts of life events may cause stress. An event that causes stress for one person may not be stressful for another person. Major life events commonly cause stress. These may be positive or negative. Examples include losing your job, moving into a new home, getting married, having a baby, or losing a loved one. Less obvious life events may also cause stress, especially if they occur day after day or in combination. Examples include working long hours, driving in traffic, caring for children, being in debt, or being in a difficult relationship. What are the signs or symptoms? Stress may cause emotional symptoms including, the following:  Anxiety. This is feeling worried, afraid, on edge, overwhelmed, or out of control.  Anger. This is feeling irritated or impatient.  Depression. This is feeling sad, down, helpless, or guilty.  Difficulty focusing, remembering, or making decisions.  Stress may cause physical symptoms, including the following:  Aches and pains. These may affect your head, neck, back, stomach, or other areas of your  body.  Tight muscles or clenched jaw.  Low energy or trouble sleeping.  Stress may cause unhealthy behaviors, including the following:  Eating to feel better (overeating) or skipping meals.  Sleeping too little, too much, or both.  Working too much or putting off tasks (procrastination).  Smoking, drinking alcohol, or using drugs to feel better.  How is this diagnosed? Stress is diagnosed through an assessment by your health care provider. Your health care provider will ask questions about your symptoms and any stressful life events.Your health care provider will also ask about your medical history and may order blood tests or other tests. Certain medical conditions and medicine can cause physical symptoms similar to stress. Mental illness can cause emotional symptoms and unhealthy behaviors similar to stress. Your health care provider may refer you to a mental health professional for further evaluation. How is this treated? Stress management is the recommended treatment for stress.The goals of stress management are reducing stressful life events and coping with stress in healthy ways. Techniques for reducing stressful life events include the following:  Stress identification. Self-monitor for stress and identify what causes stress for you. These skills may help you to avoid some stressful events.  Time management. Set your priorities, keep a calendar of events, and learn to say "no." These tools can help you avoid making too many commitments.  Techniques for coping with stress include the following:  Rethinking the problem. Try to think realistically about stressful events rather than ignoring them or overreacting. Try to find the positives in a stressful  situation rather than focusing on the negatives.  Exercise. Physical exercise can release both physical and emotional tension. The key is to find a form of exercise you enjoy and do it regularly.  Relaxation techniques. These  relax the body and mind. Examples include yoga, meditation, tai chi, biofeedback, deep breathing, progressive muscle relaxation, listening to music, being out in nature, journaling, and other hobbies. Again, the key is to find one or more that you enjoy and can do regularly.  Healthy lifestyle. Eat a balanced diet, get plenty of sleep, and do not smoke. Avoid using alcohol or drugs to relax.  Strong support network. Spend time with family, friends, or other people you enjoy being around.Express your feelings and talk things over with someone you trust.  Counseling or talktherapy with a mental health professional may be helpful if you are having difficulty managing stress on your own. Medicine is typically not recommended for the treatment of stress.Talk to your health care provider if you think you need medicine for symptoms of stress. Follow these instructions at home:  Keep all follow-up visits as directed by your health care provider.  Take all medicines as directed by your health care provider. Contact a health care provider if:  Your symptoms get worse or you start having new symptoms.  You feel overwhelmed by your problems and can no longer manage them on your own. Get help right away if:  You feel like hurting yourself or someone else. This information is not intended to replace advice given to you by your health care provider. Make sure you discuss any questions you have with your health care provider. Document Released: 05/11/2001 Document Revised: 04/22/2016 Document Reviewed: 07/10/2013 Elsevier Interactive Patient Education  2017 [place] Maintenance, Female Adopting a healthy lifestyle and getting preventive care can go a long way to promote health and wellness. Talk with your health care provider about what schedule of regular examinations is right for you. This is a good chance for you to check in with your provider about disease prevention and staying  healthy. In between checkups, there are plenty of things you can do on your own. Experts have done a lot of research about which lifestyle changes and preventive measures are most likely to keep you healthy. Ask your health care provider for more information. Weight and diet Eat a healthy diet  Be sure to include plenty of vegetables, fruits, low-fat dairy products, and lean protein.  Do not eat a lot of foods high in solid fats, added sugars, or salt.  Get regular exercise. This is one of the most important things you can do for your health. ? Most adults should exercise for at least 150 minutes each week. The exercise should increase your heart rate and make you sweat (moderate-intensity exercise). ? Most adults should also do strengthening exercises at least twice a week. This is in addition to the moderate-intensity exercise.  Maintain a healthy weight  Body mass index (BMI) is a measurement that can be used to identify possible weight problems. It estimates body fat based on height and weight. Your health care provider can help determine your BMI and help you achieve or maintain a healthy weight.  For females 49 years of age and older: ? A BMI below 18.5 is considered underweight. ? A BMI of 18.5 to 24.9 is normal. ? A BMI of 25 to 29.9 is considered overweight. ? A BMI of 30 and above is considered obese.  Watch levels of  cholesterol and blood lipids  You should start having your blood tested for lipids and cholesterol at 46 years of age, then have this test every 5 years.  You may need to have your cholesterol levels checked more often if: ? Your lipid or cholesterol levels are high. ? You are older than 46 years of age. ? You are at high risk for heart disease.  Cancer screening Lung Cancer  Lung cancer screening is recommended for adults 49-66 years old who are at high risk for lung cancer because of a history of smoking.  A yearly low-dose CT scan of the lungs is  recommended for people who: ? Currently smoke. ? Have quit within the past 15 years. ? Have at least a 30-pack-year history of smoking. A pack year is smoking an average of one pack of cigarettes a day for 1 year.  Yearly screening should continue until it has been 15 years since you quit.  Yearly screening should stop if you develop a health problem that would prevent you from having lung cancer treatment.  Breast Cancer  Practice breast self-awareness. This means understanding how your breasts normally appear and feel.  It also means doing regular breast self-exams. Let your health care provider know about any changes, no matter how small.  If you are in your 20s or 30s, you should have a clinical breast exam (CBE) by a health care provider every 1-3 years as part of a regular health exam.  If you are 30 or older, have a CBE every year. Also consider having a breast X-ray (mammogram) every year.  If you have a family history of breast cancer, talk to your health care provider about genetic screening.  If you are at high risk for breast cancer, talk to your health care provider about having an MRI and a mammogram every year.  Breast cancer gene (BRCA) assessment is recommended for women who have family members with BRCA-related cancers. BRCA-related cancers include: ? Breast. ? Ovarian. ? Tubal. ? Peritoneal cancers.  Results of the assessment will determine the need for genetic counseling and BRCA1 and BRCA2 testing.  Cervical Cancer Your health care provider may recommend that you be screened regularly for cancer of the pelvic organs (ovaries, uterus, and vagina). This screening involves a pelvic examination, including checking for microscopic changes to the surface of your cervix (Pap test). You may be encouraged to have this screening done every 3 years, beginning at age 69.  For women ages 2-65, health care providers may recommend pelvic exams and Pap testing every 3 years,  or they may recommend the Pap and pelvic exam, combined with testing for human papilloma virus (HPV), every 5 years. Some types of HPV increase your risk of cervical cancer. Testing for HPV may also be done on women of any age with unclear Pap test results.  Other health care providers may not recommend any screening for nonpregnant women who are considered low risk for pelvic cancer and who do not have symptoms. Ask your health care provider if a screening pelvic exam is right for you.  If you have had past treatment for cervical cancer or a condition that could lead to cancer, you need Pap tests and screening for cancer for at least 20 years after your treatment. If Pap tests have been discontinued, your risk factors (such as having a new sexual partner) need to be reassessed to determine if screening should resume. Some women have medical problems that increase the chance of  getting cervical cancer. In these cases, your health care provider may recommend more frequent screening and Pap tests.  Colorectal Cancer  This type of cancer can be detected and often prevented.  Routine colorectal cancer screening usually begins at 47 years of age and continues through 46 years of age.  Your health care provider may recommend screening at an earlier age if you have risk factors for colon cancer.  Your health care provider may also recommend using home test kits to check for hidden blood in the stool.  A small camera at the end of a tube can be used to examine your colon directly (sigmoidoscopy or colonoscopy). This is done to check for the earliest forms of colorectal cancer.  Routine screening usually begins at age 50.  Direct examination of the colon should be repeated every 5-10 years through 46 years of age. However, you may need to be screened more often if early forms of precancerous polyps or small growths are found.  Skin Cancer  Check your skin from head to toe regularly.  Tell your  health care provider about any new moles or changes in moles, especially if there is a change in a mole's shape or color.  Also tell your health care provider if you have a mole that is larger than the size of a pencil eraser.  Always use sunscreen. Apply sunscreen liberally and repeatedly throughout the day.  Protect yourself by wearing long sleeves, pants, a wide-brimmed hat, and sunglasses whenever you are outside.  Heart disease, diabetes, and high blood pressure  High blood pressure causes heart disease and increases the risk of stroke. High blood pressure is more likely to develop in: ? People who have blood pressure in the high end of the normal range (130-139/85-89 mm Hg). ? People who are overweight or obese. ? People who are African American.  If you are 21-107 years of age, have your blood pressure checked every 3-5 years. If you are 29 years of age or older, have your blood pressure checked every year. You should have your blood pressure measured twice-once when you are at a hospital or clinic, and once when you are not at a hospital or clinic. Record the average of the two measurements. To check your blood pressure when you are not at a hospital or clinic, you can use: ? An automated blood pressure machine at a pharmacy. ? A home blood pressure monitor.  If you are between 39 years and 60 years old, ask your health care provider if you should take aspirin to prevent strokes.  Have regular diabetes screenings. This involves taking a blood sample to check your fasting blood sugar level. ? If you are at a normal weight and have a low risk for diabetes, have this test once every three years after 46 years of age. ? If you are overweight and have a high risk for diabetes, consider being tested at a younger age or more often. Preventing infection Hepatitis B  If you have a higher risk for hepatitis B, you should be screened for this virus. You are considered at high risk for  hepatitis B if: ? You were born in a country where hepatitis B is common. Ask your health care provider which countries are considered high risk. ? Your parents were born in a high-risk country, and you have not been immunized against hepatitis B (hepatitis B vaccine). ? You have HIV or AIDS. ? You use needles to inject street drugs. ? You  live with someone who has hepatitis B. ? You have had sex with someone who has hepatitis B. ? You get hemodialysis treatment. ? You take certain medicines for conditions, including cancer, organ transplantation, and autoimmune conditions.  Hepatitis C  Blood testing is recommended for: ? Everyone born from [birth year] through 1965. ? Anyone with known risk factors for hepatitis C.  Sexually transmitted infections (STIs)  You should be screened for sexually transmitted infections (STIs) including gonorrhea and chlamydia if: ? You are sexually active and are younger than 46 years of age. ? You are older than 46 years of age and your health care provider tells you that you are at risk for this type of infection. ? Your sexual activity has changed since you were last screened and you are at an increased risk for chlamydia or gonorrhea. Ask your health care provider if you are at risk.  If you do not have HIV, but are at risk, it may be recommended that you take a prescription medicine daily to prevent HIV infection. This is called pre-exposure prophylaxis (PrEP). You are considered at risk if: ? You are sexually active and do not regularly use condoms or know the HIV status of your partner(s). ? You take drugs by injection. ? You are sexually active with a partner who has HIV.  Talk with your health care provider about whether you are at high risk of being infected with HIV. If you choose to begin PrEP, you should first be tested for HIV. You should then be tested every 3 months for as long as you are taking PrEP. Pregnancy  If you are premenopausal and you may  become pregnant, ask your health care provider about preconception counseling.  If you may become pregnant, take 400 to 800 micrograms (mcg) of folic acid every day.  If you want to prevent pregnancy, talk to your health care provider about birth control (contraception). Osteoporosis and menopause  Osteoporosis is a disease in which the bones lose minerals and strength with aging. This can result in serious bone fractures. Your risk for osteoporosis can be identified using a bone density scan.  If you are 76 years of age or older, or if you are at risk for osteoporosis and fractures, ask your health care provider if you should be screened.  Ask your health care provider whether you should take a calcium or vitamin D supplement to lower your risk for osteoporosis.  Menopause may have certain physical symptoms and risks.  Hormone replacement therapy may reduce some of these symptoms and risks. Talk to your health care provider about whether hormone replacement therapy is right for you. Follow these instructions at home:  Schedule regular health, dental, and eye exams.  Stay current with your immunizations.  Do not use any tobacco products including cigarettes, chewing tobacco, or electronic cigarettes.  If you are pregnant, do not drink alcohol.  If you are breastfeeding, limit how much and how often you drink alcohol.  Limit alcohol intake to no more than 1 drink per day for nonpregnant women. One drink equals 12 ounces of beer, 5 ounces of wine, or 1 ounces of hard liquor.  Do not use street drugs.  Do not share needles.  Ask your health care provider for help if you need support or information about quitting drugs.  Tell your health care provider if you often feel depressed.  Tell your health care provider if you have ever been abused or do not feel safe at home.  This information is not intended to replace advice given to you by your health care provider. Make sure you  discuss any questions you have with your health care provider. Document Released: 05/31/2011 Document Revised: 04/22/2016 Document Reviewed: 08/19/2015 Elsevier Interactive Patient Education  [employer].

## 2017-09-26 NOTE — Assessment & Plan Note (Signed)
No flare in the last year. She does use advair more diligently during the winter and fall. Albuterol and advair refilled today. No flare. We talked about how GERD can cause asthma flares and need for exercise. She again declines flu vaccine.

## 2018-01-26 ENCOUNTER — Other Ambulatory Visit: Payer: Self-pay | Admitting: Internal Medicine

## 2018-04-27 ENCOUNTER — Other Ambulatory Visit: Payer: Self-pay | Admitting: Internal Medicine

## 2018-06-07 ENCOUNTER — Ambulatory Visit: Payer: Self-pay | Admitting: *Deleted

## 2018-06-07 ENCOUNTER — Encounter: Payer: Self-pay | Admitting: Internal Medicine

## 2018-06-07 ENCOUNTER — Ambulatory Visit: Payer: 59 | Admitting: Internal Medicine

## 2018-06-07 VITALS — BP 110/84 | HR 74 | Temp 98.5°F | Ht 64.5 in | Wt 177.0 lb

## 2018-06-07 DIAGNOSIS — H8113 Benign paroxysmal vertigo, bilateral: Secondary | ICD-10-CM | POA: Diagnosis not present

## 2018-06-07 MED ORDER — MECLIZINE HCL 25 MG PO TABS: 25.0000 mg | ORAL_TABLET | Freq: Three times a day (TID) | ORAL | 0 refills | Status: DC | PRN

## 2018-06-07 NOTE — Patient Instructions (Signed)
We have sent in meclizine when you are feeling dizzy.   We have given you some exercises to do to help you feel better and break this vertigo spell.    Benign Positional Vertigo Vertigo is the feeling that you or your surroundings are moving when they are not. Benign positional vertigo is the most common form of vertigo. The cause of this condition is not serious (is benign). This condition is triggered by certain movements and positions (is positional). This condition can be dangerous if it occurs while you are doing something that could endanger you or others, such as driving. What are the causes? In many cases, the cause of this condition is not known. It may be caused by a disturbance in an area of the inner ear that helps your brain to sense movement and balance. This disturbance can be caused by a viral infection (labyrinthitis), head injury, or repetitive motion. What increases the risk? This condition is more likely to develop in:  Women.  People who are 47 years of age or older.  What are the signs or symptoms? Symptoms of this condition usually happen when you move your head or your eyes in different directions. Symptoms may start suddenly, and they usually last for less than a minute. Symptoms may include:  Loss of balance and falling.  Feeling like you are spinning or moving.  Feeling like your surroundings are spinning or moving.  Nausea and vomiting.  Blurred vision.  Dizziness.  Involuntary eye movement (nystagmus).  Symptoms can be mild and cause only slight annoyance, or they can be severe and interfere with daily life. Episodes of benign positional vertigo may return (recur) over time, and they may be triggered by certain movements. Symptoms may improve over time. How is this diagnosed? This condition is usually diagnosed by medical history and a physical exam of the head, neck, and ears. You may be referred to a health care provider who specializes in ear, nose,  and throat (ENT) problems (otolaryngologist) or a provider who specializes in disorders of the nervous system (neurologist). You may have additional testing, including:  MRI.  A CT scan.  Eye movement tests. Your health care provider may ask you to change positions quickly while he or she watches you for symptoms of benign positional vertigo, such as nystagmus. Eye movement may be tested with an electronystagmogram (ENG), caloric stimulation, the Dix-Hallpike test, or the roll test.  An electroencephalogram (EEG). This records electrical activity in your brain.  Hearing tests.  How is this treated? Usually, your health care provider will treat this by moving your head in specific positions to adjust your inner ear back to normal. Surgery may be needed in severe cases, but this is rare. In some cases, benign positional vertigo may resolve on its own in 2-4 weeks. Follow these instructions at home: Safety  Move slowly.Avoid sudden body or head movements.  Avoid driving.  Avoid operating heavy machinery.  Avoid doing any tasks that would be dangerous to you or others if a vertigo episode would occur.  If you have trouble walking or keeping your balance, try using a cane for stability. If you feel dizzy or unstable, sit down right away.  Return to your normal activities as told by your health care provider. Ask your health care provider what activities are safe for you. General instructions  Take over-the-counter and prescription medicines only as told by your health care provider.  Avoid certain positions or movements as told by your health  care provider.  Drink enough fluid to keep your urine clear or pale yellow.  Keep all follow-up visits as told by your health care provider. This is important. Contact a health care provider if:  You have a fever.  Your condition gets worse or you develop new symptoms.  Your family or friends notice any behavioral changes.  Your nausea or  vomiting gets worse.  You have numbness or a "pins and needles" sensation. Get help right away if:  You have difficulty speaking or moving.  You are always dizzy.  You faint.  You develop severe headaches.  You have weakness in your legs or arms.  You have changes in your hearing or vision.  You develop a stiff neck.  You develop sensitivity to light. This information is not intended to replace advice given to you by your health care provider. Make sure you discuss any questions you have with your health care provider. Document Released: 08/23/2006 Document Revised: 04/22/2016 Document Reviewed: 03/10/2015 Elsevier Interactive Patient Education  [employer]2018 [employer]

## 2018-06-07 NOTE — Telephone Encounter (Signed)
Pt c/o dizziness with room spinning. Some nausea no vomiting. First time was on July 4 th. And now (today). Took Benadryl with the first episode and helped a little and now happening again. At work now, hard to focus. Fiance drove her to work.  Appointment scheduled for today with her pcp. [facility] [address]   Reason for Disposition . [1] MODERATE dizziness (e.g., vertigo; feels very unsteady, interferes with normal activities) AND [2] has NOT been evaluated by physician for this  Answer Assessment - Initial Assessment Questions 1. DESCRIPTION: "Describe your dizziness."     Feels like she is drunk 2. VERTIGO: "Do you feel like either you or the room is spinning or tilting?"      yes 3. LIGHTHEADED: "Do you feel lightheaded?" (e.g., somewhat faint, woozy, weak upon standing)     woozy 4. SEVERITY: "How bad is it?"  "Can you walk?"   - MILD - Feels unsteady but walking normally.   - MODERATE - Feels very unsteady when walking, but not falling; interferes with normal activities (e.g., school, work) .   - SEVERE - Unable to walk without falling (requires assistance).     Mild to moderate 5. ONSET:  "When did the dizziness begin?"     July 4th 6. AGGRAVATING FACTORS: "Does anything make it worse?" (e.g., standing, change in head position)     Movement makes it worst 7. CAUSE: "What do you think is causing the dizziness?"     Not sure 8. RECURRENT SYMPTOM: "Have you had dizziness before?" If so, ask: "When was the last time?" "What happened that time?"     no 9. OTHER SYMPTOMS: "Do you have any other symptoms?" (e.g., headache, weakness, numbness, vomiting, earache)     Nausea from room spinning, left ear hurts every once and a while and has been going on 10. PREGNANCY: "Is there any chance you are pregnant?" "When was your last menstrual period?"       NO and LMP 10 years IUD  Protocols used: DIZZINESS - VERTIGO-A-AH

## 2018-06-07 NOTE — Telephone Encounter (Signed)
Noted  

## 2018-06-07 NOTE — Progress Notes (Signed)
   Subjective:    Patient ID: Amy Romero, female    DOB: [birth year], 47 y.o.   MRN: [id number]  HPI The patient is a 47 YO female coming in for vertigo episodes. One last week and she was having room spinning. Started in the middle of the day. She was having concurrent sinus pressure and drainage so took sudafed and went to sleep. When she awoke it was gone. She then had several days without vertigo symptoms. Her sinuses improved. Last night she started having vertigo in the evening. She went to bed and woke up and was still having the episode. It is currently present. She did not drive here herself. She is having room spinning, nausea, no vomiting. Denies fevers or chills. No recent illness. No recent change in activity or diet. Denies diarrhea or constipation. Denies cough or SOB. Denies ear pain or pressure. Denies sinus symptoms.   Review of Systems  Constitutional: Negative.   HENT: Negative.   Eyes: Positive for photophobia and visual disturbance.  Respiratory: Negative for cough, chest tightness and shortness of breath.   Cardiovascular: Negative for chest pain, palpitations and leg swelling.  Gastrointestinal: Negative for abdominal distention, abdominal pain, constipation, diarrhea, nausea and vomiting.  Musculoskeletal: Negative.   Skin: Negative.   Neurological: Positive for dizziness. Negative for tremors, seizures, speech difficulty, weakness, light-headedness and headaches.  Psychiatric/Behavioral: Negative.       Objective:   Physical Exam  Constitutional: She is oriented to person, place, and time. She appears well-developed and well-nourished. She appears distressed.  HENT:  Head: Normocephalic and atraumatic.  Eyes: EOM are normal.  Neck: Normal range of motion.  Cardiovascular: Normal rate and regular rhythm.  Pulmonary/Chest: Effort normal and breath sounds normal. No respiratory distress. She has no wheezes. She has no rales.  Abdominal: Soft. She exhibits  no distension. There is no tenderness. There is no rebound.  Musculoskeletal: She exhibits no edema.  Neurological: She is alert and oriented to person, place, and time. Coordination abnormal.  Some hesitancy in walking and mild discomfort with moving her head  Skin: Skin is warm and dry.   Vitals:   06/07/18 1056  BP: 110/84  Pulse: 74  Temp: 98.5 F (36.9 C)  TempSrc: Oral  SpO2: 98%  Weight: 177 lb (80.3 kg)  Height: 5' 4.5" (1.638 m)      Assessment & Plan:

## 2018-06-08 ENCOUNTER — Encounter: Payer: Self-pay | Admitting: Internal Medicine

## 2018-06-08 DIAGNOSIS — H8110 Benign paroxysmal vertigo, unspecified ear: Secondary | ICD-10-CM | POA: Insufficient documentation

## 2018-06-08 NOTE — Assessment & Plan Note (Signed)
Given epley maneuver and explained this. Rx for meclizine to help with acute episodes. There are no signs of ear infection on exam today.

## 2018-07-24 ENCOUNTER — Ambulatory Visit: Payer: Self-pay

## 2018-07-24 ENCOUNTER — Telehealth: Payer: Self-pay | Admitting: Internal Medicine

## 2018-07-24 ENCOUNTER — Encounter: Payer: Self-pay | Admitting: *Deleted

## 2018-07-24 NOTE — Telephone Encounter (Signed)
This encounter was created in error - please disregard.

## 2018-07-24 NOTE — Telephone Encounter (Signed)
Patient called, left VM to return call to the office to discuss sinus congestion symptoms and the request for Tessalon.   Copied from CRM [phone]#[id number]. Topic: Quick Communication - Rx Refill/Question >> Jul 24, 2018  3:27 PM Maia PettiesOrtiz, Kristie S wrote: Medication: benzonatate (TESSALON) 200 MG capsule - pt has sinus congestion/drainage/cough and these help - pt requesting refill Has the patient contacted their pharmacy? no Preferred Pharmacy (with phone number or street name): [id number] [place] - [address] [facility] [address]        [phone] (Phone) [phone] (Fax)

## 2018-07-24 NOTE — Telephone Encounter (Signed)
Copied from CRM [id number]. Topic: Quick Communication - Rx Refill/Question >> Jul 24, 2018  3:27 PM Maia PettiesOrtiz, Kristie S wrote: Medication: benzonatate (TESSALON) 200 MG capsule - pt has sinus congestion/drainage/cough and these help - pt requesting refill Has the patient contacted their pharmacy? no Preferred Pharmacy (with phone number or street name): [id number] [place] - [address] [facility] [address] [phone] (Phone) [phone] (Fax)

## 2018-07-24 NOTE — Telephone Encounter (Addendum)
Pt called with requesting benzonatate 200 mg for a cough she has. She has allergies and has started coughing . She took the benzonatate today and it helped calm her cough down. She is drinking fluids, using a humidifier along with taking this medication., She has a hx of asthma so she wheezes. She denies fever, productive cough.  She was prescribed this medication previously. She is asking her pcp to given her a call back regarding a prescription for this medication  Reason for Disposition . Cough with cold symptoms (e.g., runny nose, postnasal drip, throat clearing)  Answer Assessment - Initial Assessment Questions 1. ONSET: "When did the cough begin?"      Last tuesday 2. SEVERITY: "How bad is the cough today?"       moderate 3. RESPIRATORY DISTRESS: "Describe your breathing."      Wheezing a little 4. FEVER: "Do you have a fever?" If so, ask: "What is your temperature, how was it measured, and when did it start?"     No feverd 5. HEMOPTYSIS: "Are you coughing up any blood?" If so ask: "How much?" (flecks, streaks, tablespoons, etc.)     No  6. TREATMENT: "What have you done so far to treat the cough?" (e.g., meds, fluids, humidifier)     Meds, fluids, humidifier 7. CARDIAC HISTORY: "Do you have any history of heart disease?" (e.g., heart attack, congestive heart failure)      no 8. LUNG HISTORY: "Do you have any history of lung disease?"  (e.g., pulmonary embolus, asthma, emphysema)     asthma 9. PE RISK FACTORS: "Do you have a history of blood clots?" (or: recent major surgery, recent prolonged travel, bedridden)     no 10. OTHER SYMPTOMS: "Do you have any other symptoms? (e.g., runny nose, wheezing, chest pain)       Wheezing,runny nose 11. PREGNANCY: "Is there any chance you are pregnant?" "When was your last menstrual period?"       No periods  12. TRAVEL: "Have you traveled out of the country in the last month?" (e.g., travel history, exposures)       no  Protocols used: COUGH  - ACUTE NON-PRODUCTIVE-A-AH

## 2018-07-24 NOTE — Telephone Encounter (Signed)
See triage encounter.

## 2018-07-25 MED ORDER — BENZONATATE 200 MG PO CAPS: 200.0000 mg | ORAL_CAPSULE | Freq: Three times a day (TID) | ORAL | 0 refills | Status: DC | PRN

## 2018-07-25 NOTE — Telephone Encounter (Signed)
Patient informed. 

## 2018-07-25 NOTE — Addendum Note (Signed)
Addended by: Hillard DankerRAWFORD, ELIZABETH A on: 07/25/2018 08:51 AM   Modules accepted: Orders

## 2018-07-25 NOTE — Telephone Encounter (Signed)
Sent in tessalon perles to take TID prn.

## 2018-08-17 ENCOUNTER — Other Ambulatory Visit: Payer: Self-pay | Admitting: Internal Medicine

## 2018-10-16 ENCOUNTER — Other Ambulatory Visit: Payer: Self-pay | Admitting: Internal Medicine

## 2018-10-16 NOTE — Telephone Encounter (Signed)
Copied from CRM [phone]#[id number]. Topic: Quick Communication - Rx Refill/Question >> Oct 16, 2018  1:22 PM Jaquita Rectoravis, Karen A wrote: Medication: albuterol (VENTOLIN HFA) 108 (90 Base) MCG/ACT inhaler  Has the patient contacted their pharmacy? Yes.     Preferred Pharmacy (with phone number or street name): [facility][id number] - , [place] - [address] [facility] [address] [phone] (Phone) [phone] (Fax    Agent: Please be advised that RX refills may take up to 3 business days. We ask that you follow-up with your pharmacy.

## 2018-10-17 NOTE — Telephone Encounter (Signed)
Spoke with [place] at the pharmacy to verify prescription was available, who states that prescription was picked up today by the pt.

## 2018-10-23 ENCOUNTER — Encounter: Payer: Self-pay | Admitting: Internal Medicine

## 2018-10-23 ENCOUNTER — Ambulatory Visit (INDEPENDENT_AMBULATORY_CARE_PROVIDER_SITE_OTHER): Payer: 59 | Admitting: Internal Medicine

## 2018-10-23 ENCOUNTER — Other Ambulatory Visit (INDEPENDENT_AMBULATORY_CARE_PROVIDER_SITE_OTHER): Payer: 59

## 2018-10-23 VITALS — BP 130/84 | HR 65 | Temp 98.4°F | Ht 64.5 in | Wt 179.0 lb

## 2018-10-23 DIAGNOSIS — Z0000 Encounter for general adult medical examination without abnormal findings: Secondary | ICD-10-CM

## 2018-10-23 DIAGNOSIS — K219 Gastro-esophageal reflux disease without esophagitis: Secondary | ICD-10-CM | POA: Diagnosis not present

## 2018-10-23 DIAGNOSIS — J4520 Mild intermittent asthma, uncomplicated: Secondary | ICD-10-CM | POA: Diagnosis not present

## 2018-10-23 LAB — LIPID PANEL
CHOLESTEROL: 181 mg/dL (ref 0–200)
HDL: 33 mg/dL — ABNORMAL LOW (ref >39.00)
LDL CALC: 119 mg/dL — AB (ref 0–99)
NonHDL: 148.08
TRIGLYCERIDES: 143 mg/dL (ref 0.0–149.0)
Total CHOL/HDL Ratio: 5
VLDL: 28.6 mg/dL (ref 0.0–40.0)

## 2018-10-23 LAB — CBC
HEMATOCRIT: 40.3 % (ref 36.0–46.0)
Hemoglobin: 13.5 g/dL (ref 12.0–15.0)
MCHC: 33.4 g/dL (ref 30.0–36.0)
MCV: 87.2 fl (ref 78.0–100.0)
PLATELETS: 229 10*3/uL (ref 150.0–400.0)
RBC: 4.62 Mil/uL (ref 3.87–5.11)
RDW: 13 % (ref 11.5–15.5)
WBC: 6.1 10*3/uL (ref 4.0–10.5)

## 2018-10-23 LAB — COMPREHENSIVE METABOLIC PANEL
ALBUMIN: 3.9 g/dL (ref 3.5–5.2)
ALT: 10 U/L (ref 0–35)
AST: 9 U/L (ref 0–37)
Alkaline Phosphatase: 62 U/L (ref 39–117)
BUN: 9 mg/dL (ref 6–23)
CALCIUM: 8.7 mg/dL (ref 8.4–10.5)
CHLORIDE: 106 meq/L (ref 96–112)
CO2: 26 mEq/L (ref 19–32)
CREATININE: 0.93 mg/dL (ref 0.40–1.20)
GFR: 68.61 mL/min (ref >60.00)
Glucose, Bld: 103 mg/dL — ABNORMAL HIGH (ref 70–99)
Potassium: 3.9 mEq/L (ref 3.5–5.1)
Sodium: 139 mEq/L (ref 135–145)
Total Bilirubin: 0.5 mg/dL (ref 0.2–1.2)
Total Protein: 6.8 g/dL (ref 6.0–8.3)

## 2018-10-23 LAB — HEMOGLOBIN A1C: Hgb A1c MFr Bld: 5.7 % (ref 4.6–6.5)

## 2018-10-23 NOTE — Assessment & Plan Note (Signed)
Using advair currently and has albuterol inhaler. No flare currently. Fall is generally trigger for her.

## 2018-10-23 NOTE — Patient Instructions (Signed)

## 2018-10-23 NOTE — Progress Notes (Signed)
   Subjective:    Patient ID: Amy Romero, female    DOB: 01-16-1971, 47 y.o.   MRN: [id number]  HPI The patient is a 47 YO female coming in for physical.   [facility], [facility], social history reviewed and updated.   Review of Systems  Constitutional: Negative.   HENT: Negative.   Eyes: Negative.   Respiratory: Negative for cough, chest tightness and shortness of breath.   Cardiovascular: Negative for chest pain, palpitations and leg swelling.  Gastrointestinal: Negative for abdominal distention, abdominal pain, constipation, diarrhea, nausea and vomiting.  Musculoskeletal: Negative.   Skin: Negative.   Neurological: Negative.   Psychiatric/Behavioral: Negative.       Objective:   Physical Exam  Constitutional: She is oriented to person, place, and time. She appears well-developed and well-nourished.  HENT:  Head: Normocephalic and atraumatic.  Eyes: EOM are normal.  Neck: Normal range of motion.  Cardiovascular: Normal rate and regular rhythm.  Pulmonary/Chest: Effort normal and breath sounds normal. No respiratory distress. She has no wheezes. She has no rales.  Abdominal: Soft. Bowel sounds are normal. She exhibits no distension. There is no tenderness. There is no rebound.  Musculoskeletal: She exhibits no edema.  Neurological: She is alert and oriented to person, place, and time. Coordination normal.  Skin: Skin is warm and dry.  Psychiatric: She has a normal mood and affect.   Vitals:   10/23/18 1016  BP: 130/84  Pulse: 65  Temp: 98.4 F (36.9 C)  TempSrc: Oral  SpO2: 99%  Weight: 179 lb (81.2 kg)  Height: 5' 4.5" (1.638 m)      Assessment & Plan:

## 2018-10-23 NOTE — Assessment & Plan Note (Signed)
Flu shot declines. Tetanus up to date. Pap smear with gyn. Counseled about sun safety and mole surveillance. Counseled about the dangers of distracted driving. Given 10 year screening recommendations.   

## 2018-10-23 NOTE — Assessment & Plan Note (Signed)
Using tums otc. Trigger is eating late at night.

## 2018-12-13 ENCOUNTER — Other Ambulatory Visit: Payer: Self-pay | Admitting: Internal Medicine

## 2019-01-31 ENCOUNTER — Encounter: Payer: Self-pay | Admitting: Family

## 2019-01-31 ENCOUNTER — Ambulatory Visit: Payer: 59 | Admitting: Family

## 2019-01-31 ENCOUNTER — Ambulatory Visit (INDEPENDENT_AMBULATORY_CARE_PROVIDER_SITE_OTHER)
Admission: RE | Admit: 2019-01-31 | Discharge: 2019-01-31 | Disposition: A | Discharge status: Home / Self Care | Payer: 59 | Source: Ambulatory Visit | Attending: Family | Admitting: Family

## 2019-01-31 VITALS — BP 112/70 | HR 65 | Temp 98.1°F | Ht 64.5 in | Wt 180.2 lb

## 2019-01-31 DIAGNOSIS — M25532 Pain in left wrist: Secondary | ICD-10-CM | POA: Diagnosis not present

## 2019-01-31 DIAGNOSIS — M79642 Pain in left hand: Secondary | ICD-10-CM

## 2019-01-31 DIAGNOSIS — M7989 Other specified soft tissue disorders: Secondary | ICD-10-CM | POA: Diagnosis not present

## 2019-01-31 MED ORDER — MELOXICAM 15 MG PO TABS: 15.0000 mg | ORAL_TABLET | Freq: Every day | ORAL | 0 refills | Status: DC

## 2019-01-31 NOTE — Progress Notes (Signed)
Amy Romero is a 48 y.o. female with the following history as recorded in EpicCare:  Patient Active Problem List   Diagnosis Date Noted  . BPPV (benign paroxysmal positional vertigo) 06/08/2018  . GERD (gastroesophageal reflux disease) 09/26/2017  . Routine general medical examination at a health care facility 09/23/2016  . Asthma 09/22/2015    Current Outpatient Medications  Medication Sig Dispense Refill  . benzonatate (TESSALON) 200 MG capsule Take 1 capsule (200 mg total) by mouth 3 (three) times daily as needed for cough. 60 capsule 0  . Fluticasone-Salmeterol (ADVAIR) 100-50 MCG/DOSE AEPB Inhale 1 puff into the lungs 2 (two) times daily. 60 each 11  . meclizine (ANTIVERT) 25 MG tablet Take 1 tablet (25 mg total) by mouth 3 (three) times daily as needed for dizziness. 30 tablet 0  . VENTOLIN HFA 108 (90 Base) MCG/ACT inhaler INHALE 1 TO 2 PUFFS INTO THE LUNGS EVERY 6 HOURS AS NEEDED FOR WHEEZING OR SHORTNESS OF BREATH 18 g 1  . meloxicam (MOBIC) 15 MG tablet Take 1 tablet (15 mg total) by mouth daily. 30 tablet 0   No current facility-administered medications for this visit.     Allergies: Erythromycin; Keflex [cephalexin]; Penicillins; and Sertraline  Past Medical History:  Diagnosis Date  . Allergy   . Asthma   . Depression   . Frequent headaches   . History of chicken pox   . Migraines   . Urinary tract infection     Past Surgical History:  Procedure Laterality Date  . WISDOM TOOTH EXTRACTION  1992    Family History  Problem Relation Age of Onset  . Hypertension Mother   . Asthma Mother   . Hyperlipidemia Mother   . Hypertension Father   . Diabetes Father   . Hyperlipidemia Father   . Breast cancer Maternal Grandmother   . Hyperlipidemia Maternal Grandmother   . Aneurysm Maternal Grandfather   . Alcohol abuse Maternal Grandfather   . Hyperlipidemia Maternal Grandfather   . Lung cancer Maternal Grandfather   . Lung cancer Paternal Grandmother   .  Hyperlipidemia Paternal Grandmother   . Heart disease Paternal Grandfather     Social History   Tobacco Use  . Smoking status: Never Smoker  . Smokeless tobacco: Never Used  Substance Use Topics  . Alcohol use: Yes    Subjective:  Patient presents with concerns for 1 day history of left hand and wrist swelling; no known injury or trauma to the hand; no numbness/ tingling; has taken OTC Tylenol with limited benefit;     Objective:  Vitals:   01/31/19 1341  BP: 112/70  Pulse: 65  Temp: 98.1 F (36.7 C)  TempSrc: Oral  SpO2: 98%  Weight: 180 lb 3.2 oz (81.7 kg)  Height: 5' 4.5" (1.638 m)    General: Well developed, well nourished, in no acute distress  Skin : Warm and dry.  Head: Normocephalic and atraumatic  Eyes: Sclera and conjunctiva clear; pupils round and reactive to light; extraocular movements intact  Ears: External normal; canals clear; tympanic membranes normal  Oropharynx: Pink, supple. No suspicious lesions  Neck: Supple without thyromegaly, adenopathy  Lungs: Respirations unlabored;  Musculoskeletal: No deformities;   Extremities: No edema, cyanosis, clubbing  Vessels: Symmetric bilaterally  Neurologic: Alert and oriented; speech intact; face symmetrical; moves all extremities well; CNII-XII intact without focal deficit  Assessment:  1. Pain of left hand     Plan:  Suspect soft-tissue injury/ tendonitis; X-rays were normal; trial of Mobic  15 mg qd; continue to apply ice, rest; follow-up with sports medicine if no benefit in 2 days.   No follow-ups on file.  Orders Placed This Encounter  Procedures  . DG Hand Complete Left    Standing Status:   Future    Number of Occurrences:   1    Standing Expiration Date:   04/01/2020    Order Specific Question:   Reason for Exam (SYMPTOM  OR DIAGNOSIS REQUIRED)    Answer:   left hand pain/ swelling    Order Specific Question:   Is patient pregnant?    Answer:   No    Order Specific Question:   Preferred imaging  location?    Answer:   [facility]    Order Specific Question:   Radiology Contrast Protocol - do NOT remove file path    Answer:   \\[email or web address]\Radiant\DXFluoroContrastProtocols.pdf  . DG Wrist Complete Left    Standing Status:   Future    Number of Occurrences:   1    Standing Expiration Date:   04/01/2020    Order Specific Question:   Reason for Exam (SYMPTOM  OR DIAGNOSIS REQUIRED)    Answer:   left wrist pain    Order Specific Question:   Is patient pregnant?    Answer:   No    Order Specific Question:   Preferred imaging location?    Answer:   [facility]    Order Specific Question:   Radiology Contrast Protocol - do NOT remove file path    Answer:   \\[email or web address]\Radiant\DXFluoroContrastProtocols.pdf    Requested Prescriptions   Signed Prescriptions Disp Refills  . meloxicam (MOBIC) 15 MG tablet 30 tablet 0    Sig: Take 1 tablet (15 mg total) by mouth daily.

## 2019-01-31 NOTE — Patient Instructions (Signed)
Please continue to rest, ice the wrist; let's try Meloxican for the pain/ swelling; if symptoms are still present in 2 days, come back and see the sports medicine provider- he may give you a steroid shot.

## 2019-02-02 ENCOUNTER — Ambulatory Visit: Payer: 59 | Admitting: Family Medicine

## 2019-02-02 ENCOUNTER — Ambulatory Visit: Payer: Self-pay

## 2019-02-02 ENCOUNTER — Encounter: Payer: Self-pay | Admitting: Family Medicine

## 2019-02-02 VITALS — BP 130/88 | HR 88 | Resp 16 | Ht 64.5 in | Wt 181.0 lb

## 2019-02-02 DIAGNOSIS — M79642 Pain in left hand: Secondary | ICD-10-CM | POA: Diagnosis not present

## 2019-02-02 NOTE — Assessment & Plan Note (Signed)
There appears to be a cyst or mass within the wrist.  This is likely relating to the swelling in her hand.  Pulses appear to be intact.   -MRI with and without contrast to evaluate for mass or cyst. -Counseled on supportive care. - Given occasions to seek more immediate care if developing numbness or tingling.Marland Kitchen

## 2019-02-02 NOTE — Progress Notes (Signed)
Amy Romero - 48 y.o. female MRN [id number]  Date of birth: 02/04/1971  SUBJECTIVE:  Including CC & ROS.  No chief complaint on file.   Amy Romero is a 48 y.o. female that is presenting with left wrist and hand pain.  As well as swelling.  Her symptoms been occurring for about a week.  She denies any inciting event.  She has pain with wrist flexion and extension.  She also has pain with supination and pronation of the wrist.  She has noticeable swelling but no discoloration of the distal arm wrist and hand.  Has not had any travel recently no change in medications.  Pain seems to be generalized over the dorsum of the wrist and hand.  The pain is a throbbing sensation.  Denies any numbness or tingling..  Independent review of the left hand and wrist x-ray from 3/4 shows a normal exam.   Review of Systems  Constitutional: Negative for fever.  HENT: Negative for congestion.   Respiratory: Negative for cough.   Cardiovascular: Negative for chest pain.  Gastrointestinal: Negative for abdominal distention.  Musculoskeletal: Negative for gait problem.  Skin: Negative for color change.  Neurological: Negative for weakness.  Hematological: Negative for adenopathy.    HISTORY: Past Medical, Surgical, Social, and Family History Reviewed & Updated per EMR.   Pertinent Historical Findings include:  Past Medical History:  Diagnosis Date  . Allergy   . Asthma   . Depression   . Frequent headaches   . History of chicken pox   . Migraines   . Urinary tract infection     Past Surgical History:  Procedure Laterality Date  . WISDOM TOOTH EXTRACTION  1992    Allergies  Allergen Reactions  . Erythromycin Nausea And Vomiting  . Keflex [Cephalexin] Itching and Swelling  . Penicillins Itching and Swelling  . Sertraline Other (See Comments)    Nightmares    Family History  Problem Relation Age of Onset  . Hypertension Mother   . Asthma Mother   . Hyperlipidemia  Mother   . Hypertension Father   . Diabetes Father   . Hyperlipidemia Father   . Breast cancer Maternal Grandmother   . Hyperlipidemia Maternal Grandmother   . Aneurysm Maternal Grandfather   . Alcohol abuse Maternal Grandfather   . Hyperlipidemia Maternal Grandfather   . Lung cancer Maternal Grandfather   . Lung cancer Paternal Grandmother   . Hyperlipidemia Paternal Grandmother   . Heart disease Paternal Grandfather      Social History   Socioeconomic History  . Marital status: Single    Spouse name: Not on file  . Number of children: Not on file  . Years of education: Not on file  . Highest education level: Not on file  Occupational History  . Not on file  Social Needs  . Financial resource strain: Not on file  . Food insecurity:    Worry: Not on file    Inability: Not on file  . Transportation needs:    Medical: Not on file    Non-medical: Not on file  Tobacco Use  . Smoking status: Never Smoker  . Smokeless tobacco: Never Used  Substance and Sexual Activity  . Alcohol use: Yes  . Drug use: No  . Sexual activity: Not on file  Lifestyle  . Physical activity:    Days per week: Not on file    Minutes per session: Not on file  . Stress: Not on file  Relationships  .  Social connections:    Talks on phone: Not on file    Gets together: Not on file    Attends religious service: Not on file    Active member of club or organization: Not on file    Attends meetings of clubs or organizations: Not on file    Relationship status: Not on file  . Intimate partner violence:    Fear of current or ex partner: Not on file    Emotionally abused: Not on file    Physically abused: Not on file    Forced sexual activity: Not on file  Other Topics Concern  . Not on file  Social History Narrative  . Not on file     PHYSICAL EXAM:  VS: BP 130/88   Pulse 88   Resp 16   Ht 5' 4.5" (1.638 m)   Wt 181 lb (82.1 kg)   LMP 05/07/2013   SpO2 98%   BMI 30.59 kg/m  Physical  Exam Gen: NAD, alert, cooperative with exam, well-appearing ENT: normal lips, normal nasal mucosa,  Eye: normal EOM, normal conjunctiva and lids CV:  no edema, +2 pedal pulses   Resp: no accessory muscle use, non-labored,  Skin: no rashes, no areas of induration  Neuro: normal tone, normal sensation to touch Psych:  normal insight, alert and oriented MSK:  Left wrist/Hand: Obvious swelling in a glove like pattern  Pain with wrist and finger flexion and extension  No color changes.  Normal ROM  Normal grip strength  Pain most exquisite over the distal ulna and the lateral wrist. Neurovascularly intact.  Limited ultrasound: left wrist:  Subcutaneous tissue swelling. Normal-appearing distal radius and ulna. Normal-appearing dorsal extensors. There appears to be a cystic change or mass within the wrist itself.  Appears just distal to the ulna and superior to the carpal bones themselves.  Vascular testing suggest that the ulnar artery is patent.  Summary: Findings suggest a cystic or mass within the wrist itself.  Ultrasound and interpretation by Clare Gandy, MD      ASSESSMENT & PLAN:   Left hand pain There appears to be a cyst or mass within the wrist.  This is likely relating to the swelling in her hand.  Pulses appear to be intact.   -MRI with and without contrast to evaluate for mass or cyst. -Counseled on supportive care. - Given occasions to seek more immediate care if developing numbness or tingling.Marland Kitchen

## 2019-02-02 NOTE — Patient Instructions (Signed)
Nice to meet you  You will get a call to schedule the MRI  I will call you after it is completed.

## 2019-02-07 ENCOUNTER — Other Ambulatory Visit: Payer: Self-pay

## 2019-02-07 ENCOUNTER — Ambulatory Visit
Admission: RE | Admit: 2019-02-07 | Discharge: 2019-02-07 | Disposition: A | Discharge status: Home / Self Care | Payer: 59 | Source: Ambulatory Visit | Attending: Family Medicine | Admitting: Family Medicine

## 2019-02-07 DIAGNOSIS — M67432 Ganglion, left wrist: Secondary | ICD-10-CM | POA: Diagnosis not present

## 2019-02-07 DIAGNOSIS — M79642 Pain in left hand: Secondary | ICD-10-CM

## 2019-02-07 MED ORDER — GADOBENATE DIMEGLUMINE 529 MG/ML IV SOLN
17.0000 mL | Freq: Once | INTRAVENOUS | Status: AC | PRN
  Administered 2019-02-07: 17 mL via INTRAVENOUS

## 2019-02-08 ENCOUNTER — Encounter: Payer: Self-pay | Admitting: Family Medicine

## 2019-02-08 ENCOUNTER — Telehealth: Payer: Self-pay | Admitting: Internal Medicine

## 2019-02-08 ENCOUNTER — Telehealth: Payer: Self-pay | Admitting: Family Medicine

## 2019-02-08 NOTE — Telephone Encounter (Signed)
Left VM for patient. If she calls back please have her speak with a nurse/CMA and inform that she has a cyst in her wrist. We could try draining this or send her to surgery. The [facility] can report results to patient.   If any questions then please take the best time and phone number to call and I will try to call her back.   Myra Rude, MD [facility] 02/08/2019, 11:23 AM

## 2019-02-08 NOTE — Telephone Encounter (Signed)
Spoke with patient  Amy Rude, MD [facility] 02/08/2019, 2:40 PM

## 2019-02-08 NOTE — Telephone Encounter (Signed)
Pt called for results of Mr of wrist. She states office called her to call back for the results. Results not released to [facility]. NT spoke with office. Pt will wait for call back from office.

## 2019-02-11 ENCOUNTER — Other Ambulatory Visit: Payer: 59

## 2019-02-13 ENCOUNTER — Other Ambulatory Visit: Payer: Self-pay | Admitting: Internal Medicine

## 2019-04-10 ENCOUNTER — Other Ambulatory Visit: Payer: Self-pay | Admitting: Internal Medicine

## 2019-05-27 IMAGING — MR MRI OF THE LEFT WRIST WITHOUT AND WITH CONTRAST
9 series · 40 of 40 positions shown · IV contrast (multihance)
Comparison: Soft tissue ultrasound 02/02/2019

CLINICAL DATA: Pain and swelling starting 01/30/2019. Shooting pain
limited views of wrist and hand with possible cyst seen prior
ultrasound.

EXAM:
MR OF THE LEFT WRIST WITHOUT AND WITH CONTRAST
TECHNIQUE: Multiplanar multisequence MR imaging of the left wrist was performed
both before and after the administration of intravenous contrast.
CONTRAST:  17mL MULTIHANCE GADOBENATE DIMEGLUMINE 529 MG/ML IV SOLN

[Series 3: T2 fat-sat · axial · left · 3.0mm · 0.34mm/px · z∈[-17,+63]mm · 6 of 25 slices shown (1 of 2)]
[im 1/25]
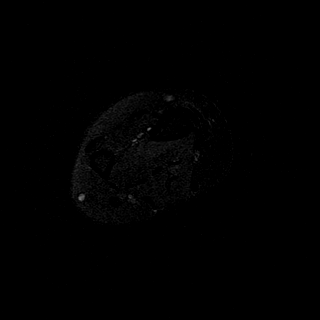
[im 5/25]
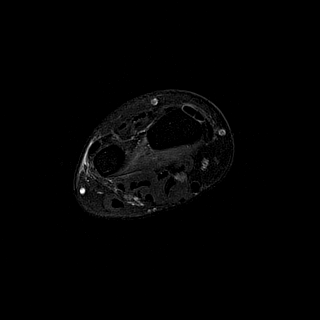
[im 10/25]
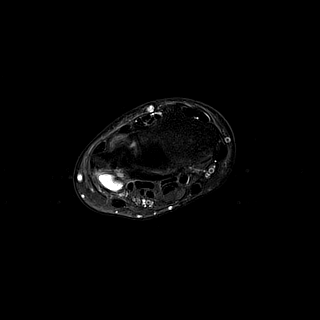
[im 15/25]
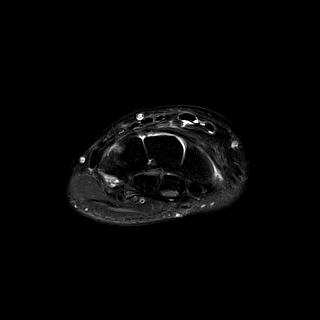
[im 20/25]
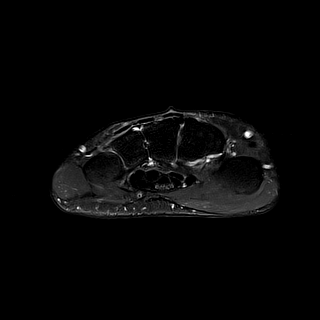
[im 25/25]
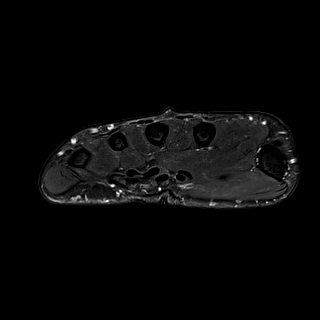

[Series 4: T1 · axial · left · 3.0mm · 0.34mm/px · z∈[-17,+63]mm · 6 of 25 slices shown (1 of 2)]
[im 1/25]
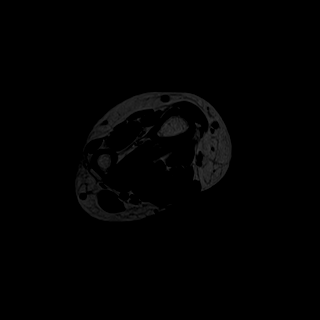
[im 5/25]
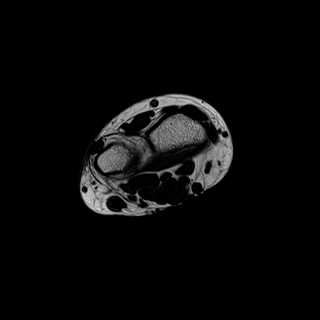
[im 10/25]
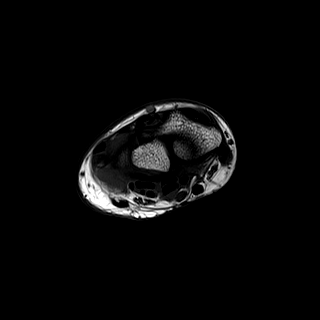
[im 15/25]
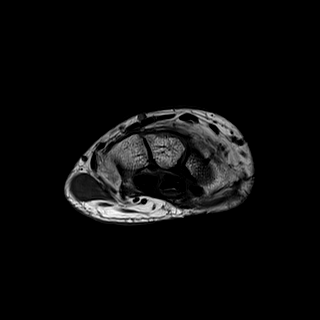
[im 20/25]
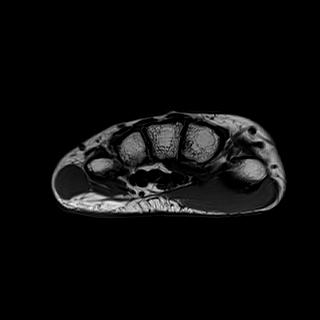
[im 25/25]
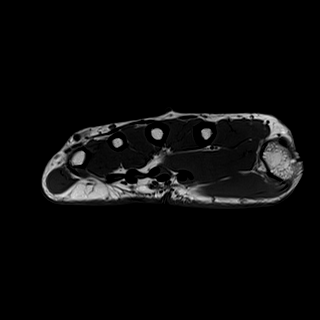

[Series 5: T1 · coronal · left · 3.0mm · 0.26mm/px · 3 of 15 slices shown (2 of 2)]
[im 1/15]
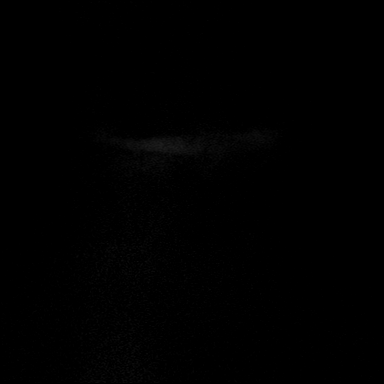
[im 8/15]
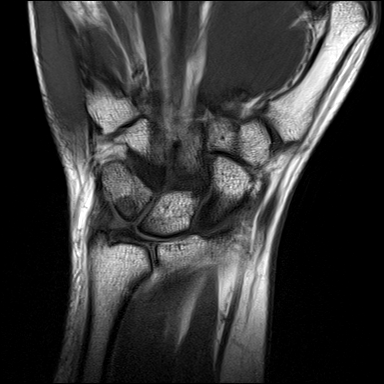
[im 15/15]
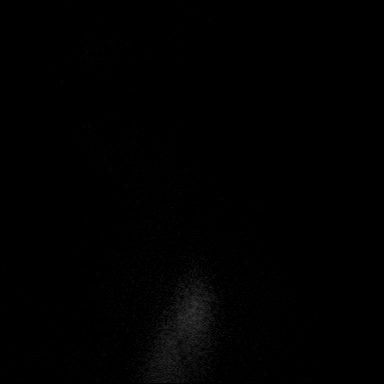

[Series 6: T2 fat-sat · coronal · left · 3.0mm · 0.31mm/px · 3 of 15 slices shown (2 of 2)]
[im 1/15]
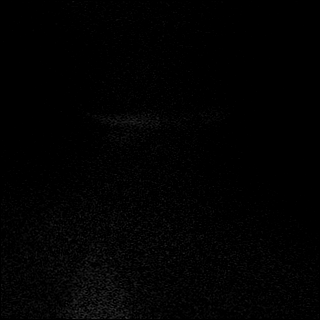
[im 8/15]
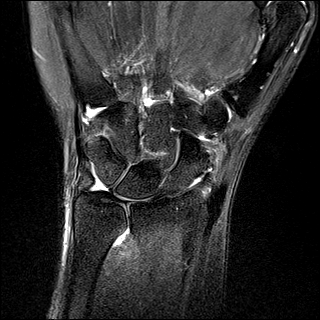
[im 15/15]
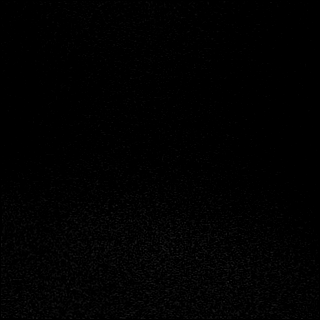

[Series 7: PD fat-sat · coronal · left · 3.0mm · 0.31mm/px · 3 of 15 slices shown (1 of 2)]
[im 1/15]
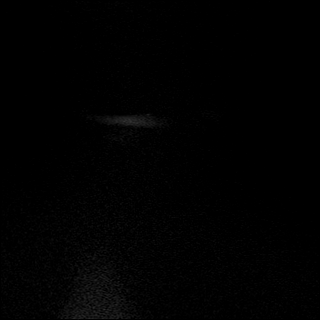
[im 8/15]
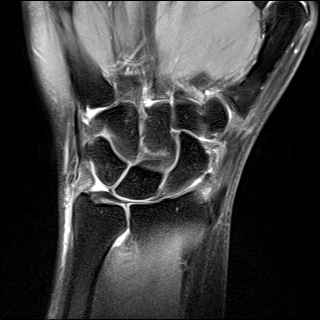
[im 15/15]
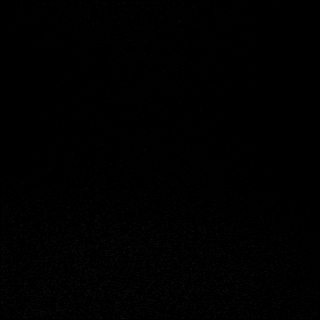

[Series 8: PD fat-sat · sagittal · left · 3.0mm · 0.31mm/px · 6 of 27 slices shown (2 of 2)]
[im 1/27]
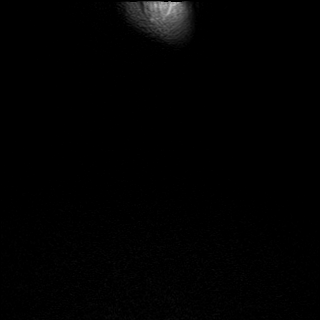
[im 6/27]
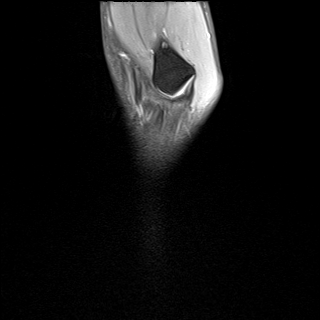
[im 11/27]
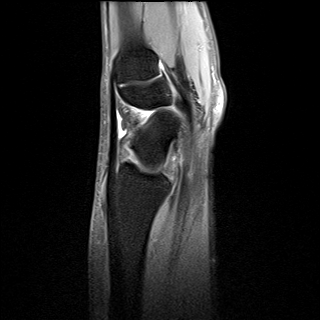
[im 16/27]
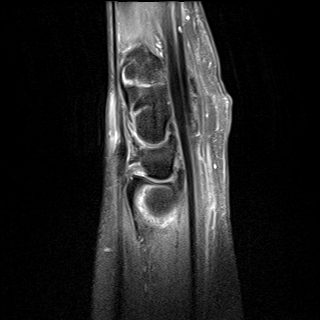
[im 21/27]
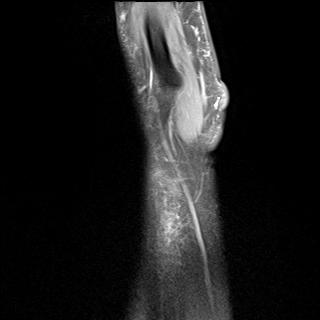
[im 27/27]
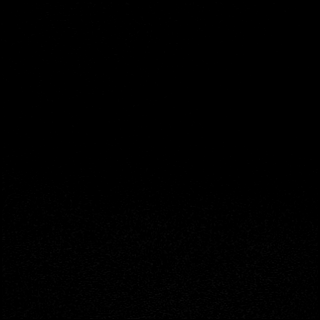

[Series 9: T1 fat-sat · axial · non-contrast · left · 3.0mm · 0.34mm/px · z∈[-17,+63]mm · 5 of 25 slices shown]
[im 1/25]
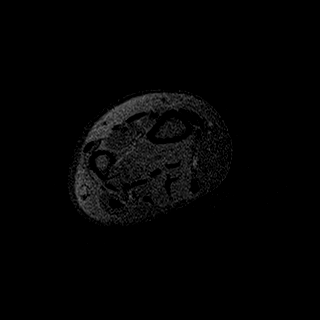
[im 7/25]
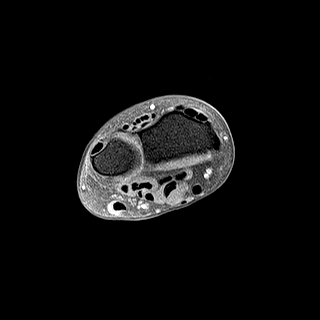
[im 13/25]
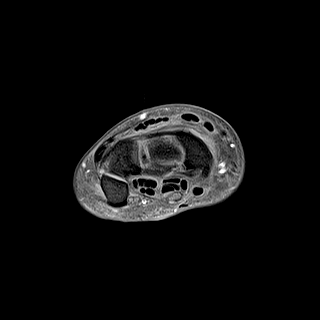
[im 19/25]
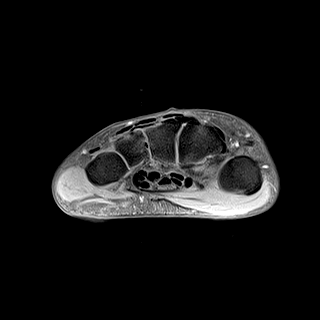
[im 25/25]
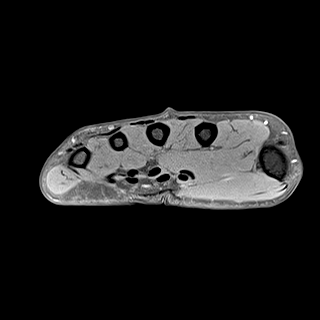

[Series 10: T1 fat-sat post-contrast · axial · left · 3.0mm · 0.34mm/px · z∈[-17,+63]mm · 5 of 25 slices shown (1 of 2)]
[im 1/25]
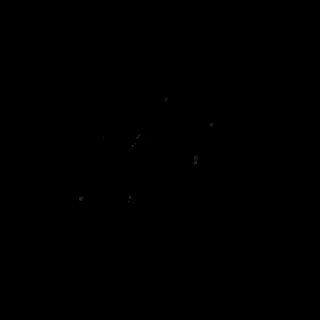
[im 7/25]
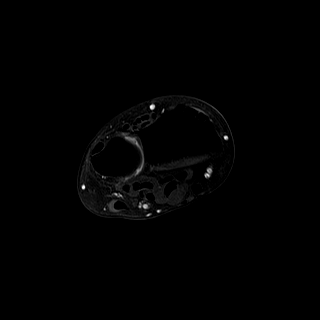
[im 13/25]
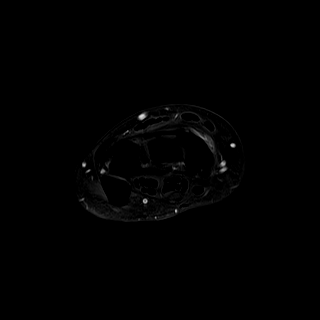
[im 19/25]
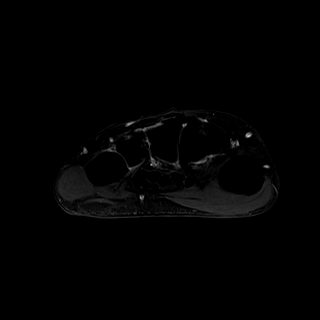
[im 25/25]
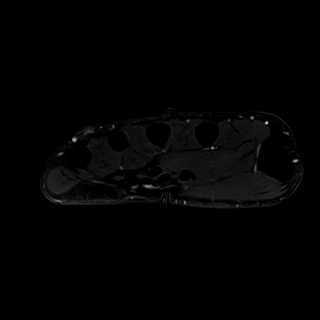

[Series 11: T1 fat-sat post-contrast · coronal · left · 3.0mm · 0.26mm/px · 3 of 15 slices shown (2 of 2)]
[im 1/15]
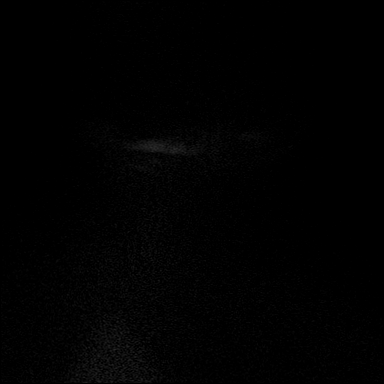
[im 8/15]
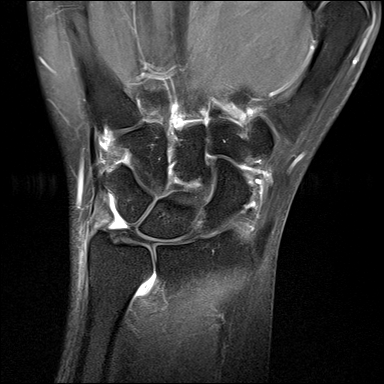
[im 15/15]
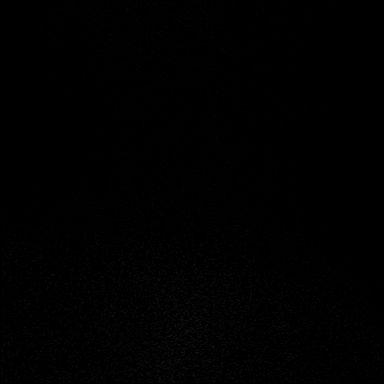

[40 of 40 positions shown; findings below may reference images not displayed]

FINDINGS: Ligaments: Intact scapholunate and lunotriquetral ligaments.

Triangular fibrocartilage: Intact TFCC.

Tendons: Intact flexor and extensor compartment tendons.

Carpal tunnel/median nerve: Normal carpal tunnel. Normal median
nerve.

Guyon's canal: Normal.

Joint/cartilage: Trace fluid in the distal radioulnar joint without
apparent tear of the triangular fibrocartilage complex.

Bones/carpal alignment: No marrow signal abnormality. Normal
alignment. No aggressive osseous lesion.

Other: Cystic abnormality along the ulnar aspect of the wrist,
adjacent to the triangular fibrocartilage and thinly septated
measuring approximately 12 x 7 x 8 mm likely representing a small
ganglion cyst.
IMPRESSION: 1. Small ganglion cyst of the wrist along the ulnar aspect adjacent
to the triangular fibrocartilage complex measuring approximately 12
x 7 x 8 mm.
2. No apparent tear of the triangular fibrocartilage complex. A
trace amount of fluid is seen in the distal radioulnar joint that
may indicate presence of a TFCC tear though none is evident on this
study and may be occult. A full-thickness microperforation is not
excluded.
3. Intact flexor and extensor tendons crossing the wrist.

## 2019-07-06 ENCOUNTER — Other Ambulatory Visit: Payer: Self-pay | Admitting: Internal Medicine

## 2019-09-17 ENCOUNTER — Other Ambulatory Visit: Payer: Self-pay | Admitting: Internal Medicine

## 2019-12-07 ENCOUNTER — Other Ambulatory Visit: Payer: Self-pay | Admitting: Internal Medicine

## 2019-12-07 MED ORDER — VENTOLIN HFA 108 (90 BASE) MCG/ACT IN AERS: INHALATION_SPRAY | RESPIRATORY_TRACT | 0 refills | Status: DC

## 2019-12-07 NOTE — Telephone Encounter (Signed)
Medication Refill - Medication: VENTOLIN HFA 108 (90 Base) MCG/ACT inhaler  Patient would like a refill for a year at a time  Preferred Pharmacy (with phone number or street name):  [id number] [place], [place] - [address] AT [facility] [address] [facility] [address] Phone:  [phone]  Fax:  [phone]       Agent: Please be advised that RX refills may take up to 3 business days. We ask that you follow-up with your pharmacy.

## 2020-01-16 ENCOUNTER — Telehealth: Payer: Self-pay | Admitting: Internal Medicine

## 2020-01-16 ENCOUNTER — Other Ambulatory Visit: Payer: Self-pay | Admitting: Internal Medicine

## 2020-01-16 MED ORDER — VENTOLIN HFA 108 (90 BASE) MCG/ACT IN AERS: INHALATION_SPRAY | RESPIRATORY_TRACT | 0 refills | Status: DC

## 2020-01-16 NOTE — Telephone Encounter (Signed)
      1. Which medications need to be refilled? (please list name of each medication and dose if known) VENTOLIN HFA 108 (90 Base) MCG/ACT inhaler  2. Which pharmacy/location (including street and city if local pharmacy) is medication to be sent to? [id number] - [place], [place] - [address] AT [facility] OF [address]  3. Do they need a 30 day or 90 day supply? 30

## 2020-01-16 NOTE — Telephone Encounter (Signed)
Sent but pt needs an OV. Has not been seen by PCP since 2019.

## 2020-01-18 ENCOUNTER — Encounter: Payer: Self-pay | Admitting: Internal Medicine

## 2020-01-18 ENCOUNTER — Other Ambulatory Visit: Payer: Self-pay

## 2020-01-18 ENCOUNTER — Ambulatory Visit (INDEPENDENT_AMBULATORY_CARE_PROVIDER_SITE_OTHER): Payer: 59 | Admitting: Internal Medicine

## 2020-01-18 DIAGNOSIS — J4520 Mild intermittent asthma, uncomplicated: Secondary | ICD-10-CM

## 2020-01-18 MED ORDER — VENTOLIN HFA 108 (90 BASE) MCG/ACT IN AERS: INHALATION_SPRAY | RESPIRATORY_TRACT | 11 refills | Status: DC

## 2020-01-18 NOTE — Progress Notes (Signed)
Virtual Visit via Video Note  I connected with Tomma Rakers on 01/18/20 at 10:40 AM EST by a video enabled telemedicine application and verified that I am speaking with the correct person using two identifiers.  The patient and the provider were at separate locations throughout the entire encounter.   I discussed the limitations of evaluation and management by telemedicine and the availability of in person appointments. The patient expressed understanding and agreed to proceed. The patient and the provider were the only parties present for the visit unless noted in HPI below.  History of Present Illness: The patient is a 49 y.o. female with visit for asthma follow up. Worse as she is at home more with pandemic and has 2 cats and partner smokes (not in house but smoke on clothes). Due to pandemic she is furloughed at work and on unemployment and cannot afford her advair at this time. Using albuterol more often but no flare. Seeing counselor more often.   Observations/Objective: Appearance: normal, breathing appears normal, no coughing or dyspnea during visit, casual grooming, abdomen does not appear distended, throat normal, memory normal, mental status is A and O times 3  Assessment and Plan: See problem oriented charting  Follow Up Instructions: refill albuterol, resume advair when able  I discussed the assessment and treatment plan with the patient. The patient was provided an opportunity to ask questions and all were answered. The patient agreed with the plan and demonstrated an understanding of the instructions.   The patient was advised to call back or seek an in-person evaluation if the symptoms worsen or if the condition fails to improve as anticipated.  Myrlene Broker, MD

## 2020-01-18 NOTE — Assessment & Plan Note (Signed)
Refill albuterol and advised to keep cats out of bedroom if possible.

## 2020-02-22 ENCOUNTER — Other Ambulatory Visit: Payer: Self-pay | Admitting: Internal Medicine

## 2020-02-22 NOTE — Telephone Encounter (Signed)
    Patient calling to request refill on VENTOLIN HFA 108 (90 Base) MCG/ACT inhaler

## 2020-02-22 NOTE — Telephone Encounter (Signed)
Reviewed chart pt is up-to-date sent refills to [facility].../lmb  

## 2020-06-20 ENCOUNTER — Encounter: Payer: Self-pay | Admitting: Internal Medicine

## 2020-06-23 MED ORDER — ALBUTEROL SULFATE HFA 108 (90 BASE) MCG/ACT IN AERS: 2.0000 | INHALATION_SPRAY | Freq: Four times a day (QID) | RESPIRATORY_TRACT | 3 refills | Status: DC | PRN

## 2020-09-24 ENCOUNTER — Other Ambulatory Visit: Payer: Self-pay | Admitting: Internal Medicine

## 2020-09-24 ENCOUNTER — Encounter: Payer: Self-pay | Admitting: Internal Medicine

## 2020-09-24 NOTE — Telephone Encounter (Signed)
ERROR

## 2020-09-25 NOTE — Telephone Encounter (Signed)
   Patient requesting refill today for Advair, patient has no medication remaining for 2 months

## 2021-03-09 ENCOUNTER — Other Ambulatory Visit: Payer: Self-pay | Admitting: Internal Medicine

## 2021-05-21 ENCOUNTER — Other Ambulatory Visit: Payer: Self-pay | Admitting: Internal Medicine

## 2021-07-09 ENCOUNTER — Other Ambulatory Visit: Payer: Self-pay | Admitting: Internal Medicine

## 2021-07-13 ENCOUNTER — Telehealth: Payer: Self-pay | Admitting: Internal Medicine

## 2021-07-13 NOTE — Telephone Encounter (Signed)
Patient is requesting new rx be sent to pharmacy for VENTOLIN HFA 108 (90 Base) MCG/ACT inhaler  She would like for the new rx to state DAW bc she can not take the generic   Pharmacy:  [id number] [place], [place] - [address] AT [facility] [address] [facility] [address]  Phone:  [phone] Fax:  [phone]

## 2021-07-13 NOTE — Telephone Encounter (Signed)
Medication was refilled on 07/09/2021. LVM letting the patient know she may want to check with her pharmacy. Office number was provided.

## 2021-08-20 ENCOUNTER — Other Ambulatory Visit: Payer: Self-pay | Admitting: Internal Medicine

## 2021-08-21 ENCOUNTER — Encounter: Payer: Self-pay | Admitting: Internal Medicine

## 2021-08-21 MED ORDER — VENTOLIN HFA 108 (90 BASE) MCG/ACT IN AERS: 1.0000 | INHALATION_SPRAY | Freq: Four times a day (QID) | RESPIRATORY_TRACT | 11 refills | Status: DC | PRN

## 2021-08-21 MED ORDER — ALBUTEROL SULFATE HFA 108 (90 BASE) MCG/ACT IN AERS: INHALATION_SPRAY | RESPIRATORY_TRACT | 0 refills | Status: DC

## 2021-09-02 ENCOUNTER — Other Ambulatory Visit: Payer: Self-pay | Admitting: Internal Medicine

## 2021-09-03 MED ORDER — VENTOLIN HFA 108 (90 BASE) MCG/ACT IN AERS: 1.0000 | INHALATION_SPRAY | Freq: Four times a day (QID) | RESPIRATORY_TRACT | 11 refills | Status: DC | PRN

## 2021-09-03 NOTE — Addendum Note (Signed)
Addended by: Pincus Sanes on: 09/03/2021 07:32 AM   Modules accepted: Orders

## 2021-09-18 ENCOUNTER — Encounter: Payer: Self-pay | Admitting: Internal Medicine

## 2021-09-18 ENCOUNTER — Other Ambulatory Visit: Payer: Self-pay

## 2021-09-18 ENCOUNTER — Ambulatory Visit (INDEPENDENT_AMBULATORY_CARE_PROVIDER_SITE_OTHER): Payer: 59 | Admitting: Internal Medicine

## 2021-09-18 VITALS — BP 120/90 | HR 83 | Resp 18 | Ht 64.5 in | Wt 176.0 lb

## 2021-09-18 DIAGNOSIS — J4520 Mild intermittent asthma, uncomplicated: Secondary | ICD-10-CM

## 2021-09-18 DIAGNOSIS — Z1211 Encounter for screening for malignant neoplasm of colon: Secondary | ICD-10-CM

## 2021-09-18 DIAGNOSIS — Z0000 Encounter for general adult medical examination without abnormal findings: Secondary | ICD-10-CM | POA: Diagnosis not present

## 2021-09-18 LAB — CBC
HCT: 39.9 % (ref 36.0–46.0)
Hemoglobin: 13.4 g/dL (ref 12.0–15.0)
MCHC: 33.7 g/dL (ref 30.0–36.0)
MCV: 87.1 fl (ref 78.0–100.0)
Platelets: 257 10*3/uL (ref 150.0–400.0)
RBC: 4.58 Mil/uL (ref 3.87–5.11)
RDW: 13.3 % (ref 11.5–15.5)
WBC: 8.9 10*3/uL (ref 4.0–10.5)

## 2021-09-18 LAB — HEMOGLOBIN A1C: Hgb A1c MFr Bld: 5.7 % (ref 4.6–6.5)

## 2021-09-18 LAB — LIPID PANEL
Cholesterol: 192 mg/dL (ref 0–200)
HDL: 34.6 mg/dL — ABNORMAL LOW (ref >39.00)
NonHDL: 157.59
Total CHOL/HDL Ratio: 6
Triglycerides: 237 mg/dL — ABNORMAL HIGH (ref 0.0–149.0)
VLDL: 47.4 mg/dL — ABNORMAL HIGH (ref 0.0–40.0)

## 2021-09-18 LAB — COMPREHENSIVE METABOLIC PANEL
ALT: 15 U/L (ref 0–35)
AST: 13 U/L (ref 0–37)
Albumin: 4.1 g/dL (ref 3.5–5.2)
Alkaline Phosphatase: 69 U/L (ref 39–117)
BUN: 9 mg/dL (ref 6–23)
CO2: 29 mEq/L (ref 19–32)
Calcium: 9.5 mg/dL (ref 8.4–10.5)
Chloride: 104 mEq/L (ref 96–112)
Creatinine, Ser: 0.9 mg/dL (ref 0.40–1.20)
GFR: 74.73 mL/min (ref >60.00)
Glucose, Bld: 91 mg/dL (ref 70–99)
Potassium: 4.2 mEq/L (ref 3.5–5.1)
Sodium: 138 mEq/L (ref 135–145)
Total Bilirubin: 0.4 mg/dL (ref 0.2–1.2)
Total Protein: 7.2 g/dL (ref 6.0–8.3)

## 2021-09-18 LAB — LDL CHOLESTEROL, DIRECT: Direct LDL: 135 mg/dL

## 2021-09-18 MED ORDER — FLUTICASONE-SALMETEROL 100-50 MCG/ACT IN AEPB: 1.0000 | INHALATION_SPRAY | Freq: Two times a day (BID) | RESPIRATORY_TRACT | 11 refills | Status: DC

## 2021-09-18 NOTE — Progress Notes (Signed)
   Subjective:   Patient ID: Amy Romero, female    DOB: 1971-08-23, 50 y.o.   MRN: [id number]  HPI The patient is a 50 YO female coming in for physical.   PMH, FMH, social history reviewed and updated  Review of Systems  Constitutional: Negative.   HENT: Negative.    Eyes: Negative.   Respiratory:  Positive for shortness of breath. Negative for cough and chest tightness.   Cardiovascular:  Negative for chest pain, palpitations and leg swelling.  Gastrointestinal:  Negative for abdominal distention, abdominal pain, constipation, diarrhea, nausea and vomiting.  Musculoskeletal: Negative.   Skin: Negative.   Neurological: Negative.   Psychiatric/Behavioral: Negative.     Objective:  Physical Exam Constitutional:      Appearance: She is well-developed.  HENT:     Head: Normocephalic and atraumatic.  Cardiovascular:     Rate and Rhythm: Normal rate and regular rhythm.  Pulmonary:     Effort: Pulmonary effort is normal. No respiratory distress.     Breath sounds: Normal breath sounds. No wheezing or rales.  Abdominal:     General: Bowel sounds are normal. There is no distension.     Palpations: Abdomen is soft.     Tenderness: There is no abdominal tenderness. There is no rebound.  Musculoskeletal:     Cervical back: Normal range of motion.  Skin:    General: Skin is warm and dry.  Neurological:     Mental Status: She is alert and oriented to person, place, and time.     Coordination: Coordination normal.    Vitals:   09/18/21 1532  BP: 120/90  Pulse: 83  Resp: 18  SpO2: 97%  Weight: 176 lb (79.8 kg)  Height: 5' 4.5" (1.638 m)    This visit occurred during the SARS-CoV-2 public health emergency.  Safety protocols were in place, including screening questions prior to the visit, additional usage of staff PPE, and extensive cleaning of exam room while observing appropriate contact time as indicated for disinfecting solutions.   Assessment & Plan:

## 2021-09-18 NOTE — Assessment & Plan Note (Signed)
No flare today, some gradual worsening likely due to 2 cats now which sleep in bedroom. Refill advair and advised to use regularly. For albuterol ventolin works best for her.

## 2021-09-18 NOTE — Assessment & Plan Note (Signed)
Flu shot declines. Covid-19 booster counseled. Pneumonia declines. Shingrix declines. Tetanus declinse. Cologuard ordered. Mammogram with ob/gyn tomlin, pap smear with ob/gyn tomlin. Counseled about sun safety and mole surveillance. Counseled about the dangers of distracted driving. Given 10 year screening recommendations.

## 2021-11-24 LAB — COLOGUARD

## 2022-10-05 ENCOUNTER — Other Ambulatory Visit: Payer: Self-pay | Admitting: Obstetrics and Gynecology

## 2022-10-05 DIAGNOSIS — R928 Other abnormal and inconclusive findings on diagnostic imaging of breast: Secondary | ICD-10-CM

## 2022-10-15 ENCOUNTER — Other Ambulatory Visit: Payer: Self-pay | Admitting: Internal Medicine

## 2022-10-18 ENCOUNTER — Ambulatory Visit: Payer: Commercial Managed Care - HMO

## 2022-10-18 ENCOUNTER — Ambulatory Visit
Admission: RE | Admit: 2022-10-18 | Discharge: 2022-10-18 | Disposition: A | Discharge status: Home / Self Care | Payer: Commercial Managed Care - HMO | Source: Ambulatory Visit | Attending: Obstetrics and Gynecology | Admitting: Obstetrics and Gynecology

## 2022-10-18 DIAGNOSIS — R928 Other abnormal and inconclusive findings on diagnostic imaging of breast: Secondary | ICD-10-CM

## 2022-11-24 ENCOUNTER — Other Ambulatory Visit: Payer: Self-pay | Admitting: Internal Medicine

## 2022-11-24 ENCOUNTER — Other Ambulatory Visit: Payer: Self-pay

## 2022-11-24 DIAGNOSIS — J4520 Mild intermittent asthma, uncomplicated: Secondary | ICD-10-CM

## 2022-11-24 MED ORDER — VENTOLIN HFA 108 (90 BASE) MCG/ACT IN AERS: 1.0000 | INHALATION_SPRAY | Freq: Four times a day (QID) | RESPIRATORY_TRACT | 0 refills | Status: DC | PRN

## 2022-12-07 ENCOUNTER — Ambulatory Visit (INDEPENDENT_AMBULATORY_CARE_PROVIDER_SITE_OTHER): Payer: 59 | Admitting: Internal Medicine

## 2022-12-07 ENCOUNTER — Encounter: Payer: Self-pay | Admitting: Internal Medicine

## 2022-12-07 VITALS — BP 128/84 | HR 75 | Temp 98.1°F | Ht 64.5 in | Wt 182.0 lb

## 2022-12-07 DIAGNOSIS — Z1211 Encounter for screening for malignant neoplasm of colon: Secondary | ICD-10-CM | POA: Diagnosis not present

## 2022-12-07 DIAGNOSIS — R200 Anesthesia of skin: Secondary | ICD-10-CM | POA: Diagnosis not present

## 2022-12-07 DIAGNOSIS — Z0000 Encounter for general adult medical examination without abnormal findings: Secondary | ICD-10-CM | POA: Diagnosis not present

## 2022-12-07 DIAGNOSIS — J4520 Mild intermittent asthma, uncomplicated: Secondary | ICD-10-CM | POA: Diagnosis not present

## 2022-12-07 LAB — CBC
HCT: 42.3 % (ref 36.0–46.0)
Hemoglobin: 14.3 g/dL (ref 12.0–15.0)
MCHC: 33.8 g/dL (ref 30.0–36.0)
MCV: 87.7 fl (ref 78.0–100.0)
Platelets: 266 10*3/uL (ref 150.0–400.0)
RBC: 4.82 Mil/uL (ref 3.87–5.11)
RDW: 13.1 % (ref 11.5–15.5)
WBC: 7.2 10*3/uL (ref 4.0–10.5)

## 2022-12-07 LAB — COMPREHENSIVE METABOLIC PANEL
ALT: 12 U/L (ref 0–35)
AST: 12 U/L (ref 0–37)
Albumin: 4.3 g/dL (ref 3.5–5.2)
Alkaline Phosphatase: 74 U/L (ref 39–117)
BUN: 8 mg/dL (ref 6–23)
CO2: 28 mEq/L (ref 19–32)
Calcium: 9.2 mg/dL (ref 8.4–10.5)
Chloride: 103 mEq/L (ref 96–112)
Creatinine, Ser: 0.93 mg/dL (ref 0.40–1.20)
GFR: 71.23 mL/min (ref >60.00)
Glucose, Bld: 99 mg/dL (ref 70–99)
Potassium: 4.4 mEq/L (ref 3.5–5.1)
Sodium: 139 mEq/L (ref 135–145)
Total Bilirubin: 0.5 mg/dL (ref 0.2–1.2)
Total Protein: 7.3 g/dL (ref 6.0–8.3)

## 2022-12-07 LAB — LIPID PANEL
Cholesterol: 198 mg/dL (ref 0–200)
HDL: 37.9 mg/dL — ABNORMAL LOW (ref >39.00)
LDL Cholesterol: 124 mg/dL — ABNORMAL HIGH (ref 0–99)
NonHDL: 160.52
Total CHOL/HDL Ratio: 5
Triglycerides: 183 mg/dL — ABNORMAL HIGH (ref 0.0–149.0)
VLDL: 36.6 mg/dL (ref 0.0–40.0)

## 2022-12-07 LAB — VITAMIN B12: Vitamin B-12: 342 pg/mL (ref 211–911)

## 2022-12-07 LAB — TSH: TSH: 1.48 u[IU]/mL (ref 0.35–5.50)

## 2022-12-07 LAB — HEMOGLOBIN A1C: Hgb A1c MFr Bld: 5.8 % (ref 4.6–6.5)

## 2022-12-07 MED ORDER — VENTOLIN HFA 108 (90 BASE) MCG/ACT IN AERS: 1.0000 | INHALATION_SPRAY | Freq: Four times a day (QID) | RESPIRATORY_TRACT | 6 refills | Status: DC | PRN

## 2022-12-07 MED ORDER — FLUTICASONE-SALMETEROL 100-50 MCG/ACT IN AEPB: 1.0000 | INHALATION_SPRAY | Freq: Two times a day (BID) | RESPIRATORY_TRACT | 11 refills | Status: DC

## 2022-12-07 NOTE — Assessment & Plan Note (Signed)
Rx wixela 100/50 to use BID and she has not been using lately due to cost. Refilled ventolin as well she needs dispense as written for this.

## 2022-12-07 NOTE — Assessment & Plan Note (Signed)
Flu shot declines. Shingrix declines. Tetanus declines. Cologuard reordered. Mammogram up to date, pap smear up to date. Counseled about sun safety and mole surveillance. Counseled about the dangers of distracted driving. Given 10 year screening recommendations.

## 2022-12-07 NOTE — Patient Instructions (Addendum)
Think about the shingles vaccine let us know if you want it.

## 2022-12-07 NOTE — Progress Notes (Signed)
   Subjective:   Patient ID: Amy Romero, female    DOB: [birth year], 52 y.o.   MRN: [id number]  Leg Pain  Associated symptoms include numbness.   The patient is here for physical.  PMH, [place], social history reviewed and updated  Review of Systems  Constitutional: Negative.   HENT: Negative.    Eyes: Negative.   Respiratory:  Negative for cough, chest tightness and shortness of breath.   Cardiovascular:  Negative for chest pain, palpitations and leg swelling.  Gastrointestinal:  Negative for abdominal distention, abdominal pain, constipation, diarrhea, nausea and vomiting.  Musculoskeletal: Negative.   Skin: Negative.   Neurological:  Positive for numbness.  Psychiatric/Behavioral: Negative.      Objective:  Physical Exam Constitutional:      Appearance: She is well-developed.  HENT:     Head: Normocephalic and atraumatic.  Cardiovascular:     Rate and Rhythm: Normal rate and regular rhythm.  Pulmonary:     Effort: Pulmonary effort is normal. No respiratory distress.     Breath sounds: Normal breath sounds. No wheezing or rales.  Abdominal:     General: Bowel sounds are normal. There is no distension.     Palpations: Abdomen is soft.     Tenderness: There is no abdominal tenderness. There is no rebound.  Musculoskeletal:     Cervical back: Normal range of motion.  Skin:    General: Skin is warm and dry.  Neurological:     Mental Status: She is alert and oriented to person, place, and time.     Coordination: Coordination normal.    Vitals:   12/07/22 1045  BP: 128/84  Pulse: 75  Temp: 98.1 F (36.7 C)  TempSrc: Oral  SpO2: 96%  Weight: 182 lb (82.6 kg)  Height: 5' 4.5" (1.638 m)    Assessment & Plan:

## 2022-12-14 DIAGNOSIS — R200 Anesthesia of skin: Secondary | ICD-10-CM | POA: Insufficient documentation

## 2022-12-14 NOTE — Assessment & Plan Note (Addendum)
Checking B12 and TSH and HgA1c to determine etiology. Treat as appropriate.

## 2022-12-21 ENCOUNTER — Other Ambulatory Visit: Payer: Self-pay

## 2022-12-21 ENCOUNTER — Telehealth: Payer: Self-pay | Admitting: Internal Medicine

## 2022-12-21 DIAGNOSIS — J4520 Mild intermittent asthma, uncomplicated: Secondary | ICD-10-CM

## 2022-12-21 MED ORDER — VENTOLIN HFA 108 (90 BASE) MCG/ACT IN AERS: 1.0000 | INHALATION_SPRAY | Freq: Four times a day (QID) | RESPIRATORY_TRACT | 6 refills | Status: AC | PRN

## 2022-12-21 MED ORDER — FLUTICASONE-SALMETEROL 100-50 MCG/ACT IN AEPB: 1.0000 | INHALATION_SPRAY | Freq: Two times a day (BID) | RESPIRATORY_TRACT | 11 refills | Status: DC

## 2022-12-21 NOTE — Telephone Encounter (Signed)
I have updated patients pharmacy and have also sent in refills for patient

## 2022-12-21 NOTE — Telephone Encounter (Signed)
Patients insurance changed and [facility] is no longer in network. She needs her pharmacy on file updated to [facility], Address: [address], [place], [place] [address]. She also needs refills.  MEDICATION: VENTOLIN HFA 108 (90 Base) MCG/ACT inhaler  fluticasone-salmeterol (WIXELA INHUB) 100-50 MCG/ACT [place]  Comments: She never had filled at old pharmacy because of not being in network  **Let patient know to contact pharmacy at the end of the day to make sure medication is ready. **  ** Please notify patient to allow 48-72 hours to process**  **Encourage patient to contact the pharmacy for refills or they can request refills through [facility]**

## 2022-12-27 DIAGNOSIS — R69 Illness, unspecified: Secondary | ICD-10-CM | POA: Diagnosis not present

## 2023-01-12 DIAGNOSIS — R42 Dizziness and giddiness: Secondary | ICD-10-CM | POA: Diagnosis not present

## 2023-01-12 DIAGNOSIS — Z6829 Body mass index (BMI) 29.0-29.9, adult: Secondary | ICD-10-CM | POA: Diagnosis not present

## 2023-01-12 DIAGNOSIS — H9201 Otalgia, right ear: Secondary | ICD-10-CM | POA: Diagnosis not present

## 2023-01-12 DIAGNOSIS — R030 Elevated blood-pressure reading, without diagnosis of hypertension: Secondary | ICD-10-CM | POA: Diagnosis not present

## 2023-01-14 ENCOUNTER — Ambulatory Visit: Payer: 59 | Admitting: Internal Medicine

## 2023-01-14 VITALS — BP 136/80 | HR 100 | Temp 98.3°F | Ht 64.5 in | Wt 179.0 lb

## 2023-01-14 DIAGNOSIS — J4520 Mild intermittent asthma, uncomplicated: Secondary | ICD-10-CM

## 2023-01-14 DIAGNOSIS — H8113 Benign paroxysmal vertigo, bilateral: Secondary | ICD-10-CM | POA: Diagnosis not present

## 2023-01-14 DIAGNOSIS — J309 Allergic rhinitis, unspecified: Secondary | ICD-10-CM

## 2023-01-14 DIAGNOSIS — R42 Dizziness and giddiness: Secondary | ICD-10-CM

## 2023-01-14 MED ORDER — MECLIZINE HCL 12.5 MG PO TABS: 12.5000 mg | ORAL_TABLET | Freq: Three times a day (TID) | ORAL | 1 refills | Status: AC | PRN

## 2023-01-14 MED ORDER — DIAZEPAM 5 MG PO TABS: ORAL_TABLET | ORAL | 0 refills | Status: DC

## 2023-01-14 NOTE — Progress Notes (Unsigned)
Patient ID: Amy Romero, female   DOB: [birth year], 52 y.o.   MRN: [id number]        Chief Complaint: follow up vertigo, allergies, asthma       HPI:  Amy Romero is a 52 y.o. female here with 4 days recent onset vertigo lasting minutes at a time, positional with turning over in bed or other position change of the head with standing,  Does have several wks ongoing nasal allergy symptoms with clearish congestion, itch and sneezing, without fever, pain, ST, cough, swelling or wheezing. But Sudafed no help this time.  Denis vomiting, fever, sT, cough and Pt denies chest pain, increased sob or doe, wheezing, orthopnea, PND, increased LE swelling, palpitations, dizziness or syncope.   Pt denies fever, wt loss, night sweats, loss of appetite, or other constitutional symptoms  Pt upset today with her perception of lack of attention in timely fashion and/or by phone by this office  COVID neg at home       Wt Readings from Last 3 Encounters:  01/14/23 179 lb (81.2 kg)  12/07/22 182 lb (82.6 kg)  09/18/21 176 lb (79.8 kg)   BP Readings from Last 3 Encounters:  01/14/23 136/80  12/07/22 128/84  09/18/21 120/90         Past Medical History:  Diagnosis Date   Allergy    Asthma    Depression    Frequent headaches    History of chicken pox    Migraines    Urinary tract infection    Past Surgical History:  Procedure Laterality Date   [place]    reports that she has never smoked. She has never used smokeless tobacco. She reports current alcohol use. She reports that she does not use drugs. family history includes Alcohol abuse in her maternal grandfather; Aneurysm in her maternal grandfather; Asthma in her mother; Breast cancer in her maternal grandmother; Diabetes in her father; Heart disease in her paternal grandfather; Hyperlipidemia in her father, maternal grandfather, maternal grandmother, mother, and paternal grandmother; Hypertension in her father and  mother; Lung cancer in her maternal grandfather and paternal grandmother. Allergies  Allergen Reactions   Erythromycin Nausea And Vomiting   Keflex [Cephalexin] Itching and Swelling   Penicillins Itching and Swelling   Sertraline Other (See Comments)    Nightmares   Current Outpatient Medications on File Prior to Visit  Medication Sig Dispense Refill   benzonatate (TESSALON) 200 MG capsule      fluticasone-salmeterol (WIXELA INHUB) 100-50 MCG/ACT AEPB Inhale 1 puff into the lungs 2 (two) times daily. 60 each 11   levonorgestrel (MIRENA, 52 MG,) 20 MCG/DAY IUD Take 1 device by intrauterine route.     VENTOLIN HFA 108 (90 Base) MCG/ACT inhaler Inhale 1-2 puffs into the lungs every 6 (six) hours as needed for wheezing or shortness of breath. 18 g 6   No current facility-administered medications on file prior to visit.        ROS:  All others reviewed and negative.  Objective        PE:  BP 136/80 (BP Location: Right Arm, Patient Position: Sitting, Cuff Size: Large)   Pulse 100   Temp 98.3 F (36.8 C) (Oral)   Ht 5' 4.5" (1.638 m)   Wt 179 lb (81.2 kg)   SpO2 97%   BMI 30.25 kg/m                 Constitutional: Pt appears in NAD  HENT: Head: NCAT.                Right Ear: External ear normal.                 Left Ear: External ear normal. Bilat tm's with mild erythema.  Max sinus areas non tender.  Pharynx with mild erythema, no exudate               Eyes: . Pupils are equal, round, and reactive to light. Conjunctivae and EOM are normal               Nose: without d/c or deformity               Neck: Neck supple. Gross normal ROM               Cardiovascular: Normal rate and regular rhythm.                 Pulmonary/Chest: Effort normal and breath sounds without rales or wheezing.                Abd:  Soft, NT, ND, + BS, no organomegaly               Neurological: Pt is alert. At baseline orientation, motor grossly intact               Skin: Skin is warm. No  rashes, no other new lesions, LE edema - none               Psychiatric: Pt behavior is normal without agitation   Micro: none  Cardiac tracings I have personally interpreted today:  none  Pertinent Radiological findings (summarize): none   Lab Results  Component Value Date   WBC 7.2 12/07/2022   HGB 14.3 12/07/2022   HCT 42.3 12/07/2022   PLT 266.0 12/07/2022   GLUCOSE 99 12/07/2022   CHOL 198 12/07/2022   TRIG 183.0 (H) 12/07/2022   HDL 37.90 (L) 12/07/2022   LDLDIRECT 135.0 09/18/2021   LDLCALC 124 (H) 12/07/2022   ALT 12 12/07/2022   AST 12 12/07/2022   NA 139 12/07/2022   K 4.4 12/07/2022   CL 103 12/07/2022   CREATININE 0.93 12/07/2022   BUN 8 12/07/2022   CO2 28 12/07/2022   TSH 1.48 12/07/2022   HGBA1C 5.8 12/07/2022   Assessment/Plan:  Amy Romero is a 52 y.o. Other or two or more races [6] female with  has a past medical history of Allergy, Asthma, Depression, Frequent headaches, History of chicken pox, Migraines, and Urinary tract infection.  Vertigo Most likely peripheral middle ear related, no indication for imaging, for meclizine prn after valiudm 5 mg daily x 3 days  Allergic rhinitis Exam c/w this though little symptoms, for otc allegra, nasacort asd,  to f/u any worsening symptoms or concerns  Asthma Stable, to continue inhaler prn  Followup: Return if symptoms worsen or fail to improve.  Cathlean Cower, MD 01/15/2023 9:39 PM [facility]

## 2023-01-14 NOTE — Patient Instructions (Signed)
Please take all new medication as prescribed - the valium 5 mg per day for 3 days only  Please take all new medication as prescribed - the meclizine as needed  Please continue all other medications as before  Please have the pharmacy call with any other refills you may need.  Please continue your efforts at being more active, low cholesterol diet, and weight control.  Please keep your appointments with your specialists as you may have planned

## 2023-01-15 ENCOUNTER — Encounter: Payer: Self-pay | Admitting: Internal Medicine

## 2023-01-15 DIAGNOSIS — J309 Allergic rhinitis, unspecified: Secondary | ICD-10-CM | POA: Insufficient documentation

## 2023-01-15 DIAGNOSIS — R42 Dizziness and giddiness: Secondary | ICD-10-CM | POA: Insufficient documentation

## 2023-01-15 NOTE — Assessment & Plan Note (Addendum)
Most likely peripheral middle ear related, no indication for imaging, for meclizine prn after valiudm 5 mg daily x 3 days

## 2023-01-15 NOTE — Assessment & Plan Note (Signed)
Exam c/w this though little symptoms, for otc allegra, nasacort asd,  to f/u any worsening symptoms or concerns

## 2023-01-15 NOTE — Assessment & Plan Note (Signed)
Stable, to continue inhaler prn

## 2023-01-24 ENCOUNTER — Encounter: Payer: Self-pay | Admitting: Family Medicine

## 2023-01-24 ENCOUNTER — Ambulatory Visit: Payer: 59 | Admitting: Family Medicine

## 2023-01-24 VITALS — BP 128/72 | HR 82 | Temp 97.6°F | Resp 20 | Ht 64.5 in | Wt 181.0 lb

## 2023-01-24 DIAGNOSIS — J3081 Allergic rhinitis due to animal (cat) (dog) hair and dander: Secondary | ICD-10-CM | POA: Diagnosis not present

## 2023-01-24 DIAGNOSIS — R42 Dizziness and giddiness: Secondary | ICD-10-CM

## 2023-01-24 DIAGNOSIS — H65191 Other acute nonsuppurative otitis media, right ear: Secondary | ICD-10-CM

## 2023-01-24 MED ORDER — LEVOCETIRIZINE DIHYDROCHLORIDE 5 MG PO TABS: 5.0000 mg | ORAL_TABLET | Freq: Every evening | ORAL | 0 refills | Status: AC

## 2023-01-24 MED ORDER — AZELASTINE-FLUTICASONE 137-50 MCG/ACT NA SUSP: 1.0000 | Freq: Two times a day (BID) | NASAL | 0 refills | Status: DC

## 2023-01-24 NOTE — Progress Notes (Signed)
Assessment & Plan:  1-2. Acute effusion of right ear/Allergic rhinitis due to animal hair and dander OMT discussed regarding massage of the ear and neck to promote drainage from the eustachian tube.  - Azelastine-Fluticasone (DYMISTA) 137-50 MCG/ACT SUSP; Place 1 spray into the nose every 12 (twelve) hours.  Dispense: 23 g; Refill: 0 - levocetirizine (XYZAL) 5 MG tablet; Take 1 tablet (5 mg total) by mouth every evening.  Dispense: 30 tablet; Refill: 0  3. Vertigo Education provided on how to complete the Epley Maneuver at home.    Follow up plan: Return if symptoms worsen or fail to improve.  Hendricks Limes, MSN, APRN, FNP-C  Subjective:  HPI: Amy Romero is a 52 y.o. female presenting on 01/24/2023 for Dizziness (Started on 01/10/2023. Seen at Urgent care 2/14 and [facility] on 2/16./)  Patient reports dizziness that started 2 weeks ago.  It occurs when rolling over in the bed and upon standing. On day one she pushed fluids thinking that it may be due to dehydration.  On day two she started taking Sudafed. Neither of these were effective, so she went to urgent care on 01/12/2023 where she was advised to use Nasonex. She states she did try it but it drove her nose insane and made her feel worse. She was then seen on 01/14/2023 at which time she was given Valium x3 days and Meclizine. She states she didn't experience dizziness when rolling over for the three days she took the Valium, but because she slept so hard she didn't actually roll over at all. She took Meclizine TID until Wednesday, at which point she stopped because she was feeling better. This past Thursday was her best day. Friday and Saturday she tenderness in her lymph nodes on the right side of her neck. Sunday was a good day and this morning she was dizzy with rolling over again. She does report popping in her right ear for over a month. She has chronic allergies as she is allergic to her two cats. Denies cough, congestion, sore  throat, and headaches.     ROS: Negative unless specifically indicated above in HPI.   Relevant past medical history reviewed and updated as indicated.   Allergies and medications reviewed and updated.   Current Outpatient Medications:    fluticasone-salmeterol (WIXELA INHUB) 100-50 MCG/ACT AEPB, Inhale 1 puff into the lungs 2 (two) times daily., Disp: 60 each, Rfl: 11   levonorgestrel (MIRENA, 52 MG,) 20 MCG/DAY IUD, Take 1 device by intrauterine route., Disp: , Rfl:    meclizine (ANTIVERT) 12.5 MG tablet, Take 1 tablet (12.5 mg total) by mouth 3 (three) times daily as needed for dizziness., Disp: 40 tablet, Rfl: 1   VENTOLIN HFA 108 (90 Base) MCG/ACT inhaler, Inhale 1-2 puffs into the lungs every 6 (six) hours as needed for wheezing or shortness of breath., Disp: 18 g, Rfl: 6  Allergies  Allergen Reactions   Erythromycin Nausea And Vomiting   Keflex [Cephalexin] Itching and Swelling   Penicillins Itching and Swelling   Sertraline Other (See Comments)    Nightmares    Objective:   BP 128/72   Pulse 82   Temp 97.6 F (36.4 C)   Resp 20   Ht 5' 4.5" (1.638 m)   Wt 181 lb (82.1 kg)   BMI 30.59 kg/m    Physical Exam Vitals reviewed.  Constitutional:      General: She is not in acute distress.    Appearance: Normal appearance. She is not  ill-appearing, toxic-appearing or diaphoretic.  HENT:     Head: Normocephalic and atraumatic.     Right Ear: Ear canal and external ear normal. No laceration, drainage, swelling or tenderness. A middle ear effusion is present. There is no impacted cerumen. No foreign body. Tympanic membrane is bulging. Tympanic membrane is not injected, scarred, perforated, erythematous or retracted.     Left Ear: Tympanic membrane, ear canal and external ear normal. There is no impacted cerumen.     Nose: Nose normal. No congestion or rhinorrhea.     Right Sinus: No maxillary sinus tenderness or frontal sinus tenderness.     Left Sinus: No maxillary  sinus tenderness or frontal sinus tenderness.     Mouth/Throat:     Mouth: Mucous membranes are moist.     Pharynx: Oropharynx is clear. No oropharyngeal exudate or posterior oropharyngeal erythema.  Eyes:     General: No scleral icterus.       Right eye: No discharge.        Left eye: No discharge.     Conjunctiva/sclera: Conjunctivae normal.  Cardiovascular:     Rate and Rhythm: Normal rate and regular rhythm.     Heart sounds: Normal heart sounds. No murmur heard.    No friction rub. No gallop.  Pulmonary:     Effort: Pulmonary effort is normal. No respiratory distress.     Breath sounds: Normal breath sounds. No stridor. No wheezing, rhonchi or rales.  Musculoskeletal:        General: Normal range of motion.     Cervical back: Normal range of motion.  Lymphadenopathy:     Cervical: No cervical adenopathy.  Skin:    General: Skin is warm and dry.     Capillary Refill: Capillary refill takes less than 2 seconds.  Neurological:     General: No focal deficit present.     Mental Status: She is alert and oriented to person, place, and time. Mental status is at baseline.  Psychiatric:        Mood and Affect: Mood normal.        Behavior: Behavior normal.        Thought Content: Thought content normal.        Judgment: Judgment normal.

## 2023-01-24 NOTE — Patient Instructions (Signed)
Ear exercises

## 2023-01-26 DIAGNOSIS — F419 Anxiety disorder, unspecified: Secondary | ICD-10-CM | POA: Diagnosis not present

## 2023-01-28 ENCOUNTER — Telehealth: Payer: Self-pay | Admitting: Internal Medicine

## 2023-01-28 NOTE — Telephone Encounter (Signed)
Patient has had vertigo for approximately 3 weeks. She has been seen more than once about it and nothing seems to help. She would like a call back discussing what can be done to help. She does not want to schedule another appointment unless she has to. Best callback number is [phone].

## 2023-01-29 ENCOUNTER — Encounter: Payer: Self-pay | Admitting: Family Medicine

## 2023-01-31 NOTE — Telephone Encounter (Signed)
We can offer vestibular PT treatment to help.

## 2023-02-01 NOTE — Telephone Encounter (Signed)
Called pt gave her MD advisement. Pt states she saw [place] on Friday which she gave her a timeline how long she may have the vertigo. She states she is still taking Xyzal using meclizine as needed, and just documenting how she feels through out the day. Far passed 2 days have not woke up w/ symptoms. Will notify MD if the vertigo comes back./l,mb

## 2023-02-09 DIAGNOSIS — F419 Anxiety disorder, unspecified: Secondary | ICD-10-CM | POA: Diagnosis not present

## 2023-03-10 DIAGNOSIS — F419 Anxiety disorder, unspecified: Secondary | ICD-10-CM | POA: Diagnosis not present

## 2023-04-14 DIAGNOSIS — F419 Anxiety disorder, unspecified: Secondary | ICD-10-CM | POA: Diagnosis not present

## 2023-05-16 ENCOUNTER — Ambulatory Visit (HOSPITAL_COMMUNITY)
Admission: EM | Admit: 2023-05-16 | Discharge: 2023-05-16 | Disposition: A | Discharge status: Home / Self Care | Payer: 59 | Attending: Family Medicine | Admitting: Family Medicine

## 2023-05-16 ENCOUNTER — Encounter: Payer: Self-pay | Admitting: Internal Medicine

## 2023-05-16 ENCOUNTER — Encounter (HOSPITAL_COMMUNITY): Payer: Self-pay | Admitting: *Deleted

## 2023-05-16 ENCOUNTER — Other Ambulatory Visit: Payer: Self-pay

## 2023-05-16 DIAGNOSIS — J069 Acute upper respiratory infection, unspecified: Secondary | ICD-10-CM | POA: Diagnosis not present

## 2023-05-16 DIAGNOSIS — J4521 Mild intermittent asthma with (acute) exacerbation: Secondary | ICD-10-CM

## 2023-05-16 MED ORDER — PREDNISONE 20 MG PO TABS: 40.0000 mg | ORAL_TABLET | Freq: Every day | ORAL | 0 refills | Status: DC

## 2023-05-16 MED ORDER — HYDROCODONE BIT-HOMATROP MBR 5-1.5 MG/5ML PO SOLN: 5.0000 mL | Freq: Four times a day (QID) | ORAL | 0 refills | Status: DC | PRN

## 2023-05-16 NOTE — ED Triage Notes (Signed)
Pt reports since Friday she has had a cough,fever,chills and body aches.

## 2023-05-16 NOTE — Discharge Instructions (Signed)
Be aware, your cough medication may cause drowsiness. Please do not drive, operate heavy machinery or make important decisions while on this medication, it can cloud your judgement.  

## 2023-05-18 NOTE — ED Provider Notes (Signed)
[facility]   [id number] 05/16/23 Arrival Time: 0915  ASSESSMENT & PLAN:  1. Viral URI with cough   2. Mild intermittent asthma with acute exacerbation    Discussed typical duration of likely viral illness. No resp distress. OTC symptom care as needed.  Meds ordered this encounter  Medications   HYDROcodone bit-homatropine (HYCODAN) 5-1.5 MG/5ML syrup    Sig: Take 5 mLs by mouth every 6 (six) hours as needed for cough.    Dispense:  90 mL    Refill:  0   predniSONE (DELTASONE) 20 MG tablet    Sig: Take 2 tablets (40 mg total) by mouth daily.    Dispense:  10 tablet    Refill:  0   Has inhaler at home.   Follow-up Information     Myrlene Broker, MD.   Specialty: Internal Medicine Why: As needed. Contact information: [address] [place] [address] [phone]         [facility].   Specialty: Urgent Care Why: If worsening or failing to improve as anticipated. Contact information: [address] [place] [address] [phone]                Reviewed expectations re: course of current medical issues. Questions answered. Outlined signs and symptoms indicating need for more acute intervention. Understanding verbalized. After Visit Summary given.   SUBJECTIVE: History from: Patient. Amy Romero is a 52 y.o. female.  Pt reports since Friday she has had a cough,fever,chills and body aches.  Wheezing. Denies current SOB/CP. Normal PO intake without n/v/d.  OBJECTIVE:  Vitals:   05/16/23 0939  BP: 133/83  Pulse: (!) 108  Resp: 18  Temp: 98.6 F (37 C)  SpO2: 91%    Slight tachycardia noted. General appearance: alert; no distress Eyes: PERRLA; EOMI; conjunctiva normal HENT: ; AT; with nasal congestion Neck: supple  Lungs: speaks full sentences without difficulty; unlabored; mild bilateral wheezing Extremities: no edema Skin: warm and dry Neurologic: normal  gait Psychological: alert and cooperative; normal mood and affect   Allergies  Allergen Reactions   Erythromycin Nausea And Vomiting   Keflex [Cephalexin] Itching and Swelling   Penicillins Itching and Swelling   Sertraline Other (See Comments)    Nightmares    Past Medical History:  Diagnosis Date   Allergy    Asthma    Depression    Frequent headaches    History of chicken pox    Migraines    Urinary tract infection    Social History   Socioeconomic History   Marital status: Single    Spouse name: Not on file   Number of children: Not on file   Years of education: Not on file   Highest education level: Not on file  Occupational History   Not on file  Tobacco Use   Smoking status: Never   Smokeless tobacco: Never  Substance and Sexual Activity   Alcohol use: Yes   Drug use: No   Sexual activity: Not on file  Other Topics Concern   Not on file  Social History Narrative   Not on file   Social Determinants of Health   Financial Resource Strain: Not on file  Food Insecurity: Not on file  Transportation Needs: Not on file  Physical Activity: Not on file  Stress: Not on file  Social Connections: Not on file  Intimate Partner Violence: Not on file   Family History  Problem Relation  Age of Onset   Hypertension Mother    Asthma Mother    Hyperlipidemia Mother    Hypertension Father    Diabetes Father    Hyperlipidemia Father    Breast cancer Maternal Grandmother    Hyperlipidemia Maternal Grandmother    Aneurysm Maternal Grandfather    Alcohol abuse Maternal Grandfather    Hyperlipidemia Maternal Grandfather    Lung cancer Maternal Grandfather    Lung cancer Paternal Grandmother    Hyperlipidemia Paternal Grandmother    Heart disease Paternal Grandfather    Past Surgical History:  Procedure Laterality Date   WISDOM TOOTH EXTRACTION  1992     Mardella Layman, MD 05/18/23 1034

## 2023-05-30 ENCOUNTER — Encounter: Payer: Self-pay | Admitting: Orthopedic Surgery

## 2023-06-09 DIAGNOSIS — F419 Anxiety disorder, unspecified: Secondary | ICD-10-CM | POA: Diagnosis not present

## 2023-07-14 DIAGNOSIS — F419 Anxiety disorder, unspecified: Secondary | ICD-10-CM | POA: Diagnosis not present

## 2023-08-04 DIAGNOSIS — F419 Anxiety disorder, unspecified: Secondary | ICD-10-CM | POA: Diagnosis not present

## 2023-09-01 DIAGNOSIS — F419 Anxiety disorder, unspecified: Secondary | ICD-10-CM | POA: Diagnosis not present

## 2023-09-29 DIAGNOSIS — F419 Anxiety disorder, unspecified: Secondary | ICD-10-CM | POA: Diagnosis not present

## 2023-10-20 DIAGNOSIS — F419 Anxiety disorder, unspecified: Secondary | ICD-10-CM | POA: Diagnosis not present

## 2023-11-02 DIAGNOSIS — Z6831 Body mass index (BMI) 31.0-31.9, adult: Secondary | ICD-10-CM | POA: Diagnosis not present

## 2023-11-02 DIAGNOSIS — Z1231 Encounter for screening mammogram for malignant neoplasm of breast: Secondary | ICD-10-CM | POA: Diagnosis not present

## 2023-11-02 DIAGNOSIS — Z01419 Encounter for gynecological examination (general) (routine) without abnormal findings: Secondary | ICD-10-CM | POA: Diagnosis not present

## 2023-11-02 DIAGNOSIS — N912 Amenorrhea, unspecified: Secondary | ICD-10-CM | POA: Diagnosis not present

## 2023-12-05 DIAGNOSIS — F419 Anxiety disorder, unspecified: Secondary | ICD-10-CM | POA: Diagnosis not present

## 2023-12-29 DIAGNOSIS — F419 Anxiety disorder, unspecified: Secondary | ICD-10-CM | POA: Diagnosis not present

## 2024-01-12 ENCOUNTER — Other Ambulatory Visit: Payer: Self-pay | Admitting: Internal Medicine

## 2024-01-12 NOTE — Telephone Encounter (Signed)
Copied from CRM [id number]. Topic: Clinical - Medication Refill >> Jan 12, 2024 11:17 AM Fredrich Romans wrote: Most Recent Primary Care Visit:  Provider: Gwenlyn Fudge  Department: [facility]  Visit Type: OFFICE VISIT  Date: 01/24/2023  Medication: fluticasone-salmeterol (WIXELA INHUB) 100-50 MCG/ACT AEPB  Has the patient contacted their pharmacy? Yes (Agent: If no, request that the patient contact the pharmacy for the refill. If patient does not wish to contact the pharmacy document the reason why and proceed with request.) (Agent: If yes, when and what did the pharmacy advise?)  Is this the correct pharmacy for this prescription? Yes If no, delete pharmacy and type the correct one.  This is the patient's preferred pharmacy:  [facility] [place], [place] - [address] [place] [address] Phone: [phone] Fax: [phone]   Has the prescription been filled recently? No  Is the patient out of the medication? Yes  Has the patient been seen for an appointment in the last year OR does the patient have an upcoming appointment? Yes  Can we respond through MyChart? Yes  Agent: Please be advised that Rx refills may take up to 3 business days. We ask that you follow-up with your pharmacy.

## 2024-01-19 DIAGNOSIS — F419 Anxiety disorder, unspecified: Secondary | ICD-10-CM | POA: Diagnosis not present

## 2024-01-23 ENCOUNTER — Ambulatory Visit (INDEPENDENT_AMBULATORY_CARE_PROVIDER_SITE_OTHER): Payer: 59 | Admitting: Internal Medicine

## 2024-01-23 ENCOUNTER — Encounter: Payer: Self-pay | Admitting: Internal Medicine

## 2024-01-23 VITALS — BP 116/80 | HR 86 | Temp 99.2°F | Ht 64.5 in | Wt 184.0 lb

## 2024-01-23 DIAGNOSIS — J4520 Mild intermittent asthma, uncomplicated: Secondary | ICD-10-CM

## 2024-01-23 DIAGNOSIS — H8113 Benign paroxysmal vertigo, bilateral: Secondary | ICD-10-CM

## 2024-01-23 DIAGNOSIS — Z131 Encounter for screening for diabetes mellitus: Secondary | ICD-10-CM | POA: Diagnosis not present

## 2024-01-23 DIAGNOSIS — Z1211 Encounter for screening for malignant neoplasm of colon: Secondary | ICD-10-CM

## 2024-01-23 DIAGNOSIS — Z13220 Encounter for screening for lipoid disorders: Secondary | ICD-10-CM

## 2024-01-23 DIAGNOSIS — Z0000 Encounter for general adult medical examination without abnormal findings: Secondary | ICD-10-CM | POA: Diagnosis not present

## 2024-01-23 LAB — HEMOGLOBIN A1C: Hgb A1c MFr Bld: 5.8 % (ref 4.6–6.5)

## 2024-01-23 NOTE — Progress Notes (Unsigned)
   Subjective:   Patient ID: Amy Romero, female    DOB: [birth year], 53 y.o.   MRN: [id number]  HPI The patient is here for physical.  PMH, [facility], social history reviewed and updated  Review of Systems  Objective:  Physical Exam  Vitals:   01/23/24 1440  BP: 116/80  Pulse: 86  Temp: 99.2 F (37.3 C)  TempSrc: Oral  SpO2: 97%  Weight: 184 lb (83.5 kg)  Height: 5' 4.5" (1.638 m)    Assessment & Plan:

## 2024-01-24 ENCOUNTER — Encounter: Payer: Self-pay | Admitting: Internal Medicine

## 2024-01-24 LAB — COMPREHENSIVE METABOLIC PANEL
ALT: 17 U/L (ref 0–35)
AST: 17 U/L (ref 0–37)
Albumin: 4.3 g/dL (ref 3.5–5.2)
Alkaline Phosphatase: 75 U/L (ref 39–117)
BUN: 10 mg/dL (ref 6–23)
CO2: 27 meq/L (ref 19–32)
Calcium: 9.2 mg/dL (ref 8.4–10.5)
Chloride: 101 meq/L (ref 96–112)
Creatinine, Ser: 0.92 mg/dL (ref 0.40–1.20)
GFR: 71.59 mL/min (ref >60.00)
Glucose, Bld: 90 mg/dL (ref 70–99)
Potassium: 4 meq/L (ref 3.5–5.1)
Sodium: 139 meq/L (ref 135–145)
Total Bilirubin: 0.5 mg/dL (ref 0.2–1.2)
Total Protein: 7.5 g/dL (ref 6.0–8.3)

## 2024-01-24 LAB — CBC
HCT: 42.1 % (ref 36.0–46.0)
Hemoglobin: 13.9 g/dL (ref 12.0–15.0)
MCHC: 33.1 g/dL (ref 30.0–36.0)
MCV: 88 fL (ref 78.0–100.0)
Platelets: 275 10*3/uL (ref 150.0–400.0)
RBC: 4.78 Mil/uL (ref 3.87–5.11)
RDW: 13.2 % (ref 11.5–15.5)
WBC: 8.2 10*3/uL (ref 4.0–10.5)

## 2024-01-24 LAB — LIPID PANEL
Cholesterol: 212 mg/dL — ABNORMAL HIGH (ref 0–200)
HDL: 39 mg/dL — ABNORMAL LOW (ref >39.00)
LDL Cholesterol: 137 mg/dL — ABNORMAL HIGH (ref 0–99)
NonHDL: 173.35
Total CHOL/HDL Ratio: 5
Triglycerides: 184 mg/dL — ABNORMAL HIGH (ref 0.0–149.0)
VLDL: 36.8 mg/dL (ref 0.0–40.0)

## 2024-01-24 NOTE — Assessment & Plan Note (Signed)
 Not recurrent unless she misses xyzal.

## 2024-01-24 NOTE — Assessment & Plan Note (Signed)
 Flu shot declines. Pneumonia declines. Shingrix declines. Tetanus declines. Cologuard ordered. Mammogram counseled, pap smear counseled. Counseled about sun safety and mole surveillance. Counseled about the dangers of distracted driving. Given 10 year screening recommendations.

## 2024-01-24 NOTE — Assessment & Plan Note (Signed)
 No flare today continue advair 100/50 BID and albuterol prn.

## 2024-01-25 ENCOUNTER — Encounter: Payer: Self-pay | Admitting: Internal Medicine

## 2024-02-04 ENCOUNTER — Encounter: Payer: Self-pay | Admitting: Internal Medicine

## 2024-02-07 MED ORDER — MECLIZINE HCL 25 MG PO TABS: 25.0000 mg | ORAL_TABLET | Freq: Three times a day (TID) | ORAL | 0 refills | Status: AC | PRN

## 2024-02-09 DIAGNOSIS — F419 Anxiety disorder, unspecified: Secondary | ICD-10-CM | POA: Diagnosis not present

## 2024-02-14 ENCOUNTER — Other Ambulatory Visit: Payer: Self-pay | Admitting: Internal Medicine

## 2024-02-15 ENCOUNTER — Telehealth: Payer: Self-pay

## 2024-02-15 NOTE — Telephone Encounter (Signed)
 Copied from CRM [id number]. Topic: Clinical - Medication Question >> Feb 15, 2024  1:01 PM Taleah C wrote: Reason for CRM: pt called and asked for a message to be sent to Dr. Okey Dupre to confirm if she needs to call for refills each time in the future because on her recent prescription for Advair, there are zero refills. She mentioned that she normally gets a year worth of refills. Please call and advise.

## 2024-02-16 ENCOUNTER — Other Ambulatory Visit: Payer: Self-pay

## 2024-02-16 ENCOUNTER — Telehealth: Payer: Self-pay

## 2024-02-16 MED ORDER — FLUTICASONE-SALMETEROL 100-50 MCG/ACT IN AEPB: 1.0000 | INHALATION_SPRAY | Freq: Two times a day (BID) | RESPIRATORY_TRACT | 3 refills | Status: DC

## 2024-02-16 NOTE — Telephone Encounter (Signed)
 Please follow refill protocol based on last visit

## 2024-02-16 NOTE — Telephone Encounter (Signed)
Refill has been sent in.  

## 2024-02-16 NOTE — Telephone Encounter (Signed)
 Copied from CRM [id number]. Topic: Clinical - Medication Question >> Feb 15, 2024  1:01 PM Taleah C wrote: Reason for CRM: pt called and asked for a message to be sent to Dr. Okey Dupre to confirm if she needs to call for refills each time in the future because on her recent prescription for Advair, there are zero refills. She mentioned that she normally gets a year worth of refills. Please call and advise. >> Feb 16, 2024 12:41 PM Chantha C wrote: Patient is following up, she called yesterday about having to contact the office for every refills on fluticasone-salmeterol (ADVAIR) 100-50 MCG/ACT. Patient states the medication is a monthly maintanence medication and stress is what triggers her  asthma. Patient has not heard back from the office and if medication was sent to the pharmacy and why there are no refills, she wants an explanation. Please call back today [phone] or through Mychart.   Informed patient,  fluticasone-salmeterol (ADVAIR) 100-50 MCG/ACT was sent to [facility] [place], [place] - [address] [facility] [address] [place] [address] Phone: [phone] Fax: [phone] yesterday 02/14/24 but there's no additional refills.

## 2024-02-17 ENCOUNTER — Encounter: Payer: Self-pay | Admitting: Internal Medicine

## 2024-02-21 NOTE — Telephone Encounter (Signed)
 Is this ok to do?

## 2024-03-02 DIAGNOSIS — F419 Anxiety disorder, unspecified: Secondary | ICD-10-CM | POA: Diagnosis not present

## 2024-03-22 DIAGNOSIS — F419 Anxiety disorder, unspecified: Secondary | ICD-10-CM | POA: Diagnosis not present

## 2024-04-13 DIAGNOSIS — F419 Anxiety disorder, unspecified: Secondary | ICD-10-CM | POA: Diagnosis not present

## 2024-04-25 DIAGNOSIS — F419 Anxiety disorder, unspecified: Secondary | ICD-10-CM | POA: Diagnosis not present

## 2024-05-15 DIAGNOSIS — F419 Anxiety disorder, unspecified: Secondary | ICD-10-CM | POA: Diagnosis not present

## 2024-05-30 DIAGNOSIS — F419 Anxiety disorder, unspecified: Secondary | ICD-10-CM | POA: Diagnosis not present

## 2024-06-06 DIAGNOSIS — F419 Anxiety disorder, unspecified: Secondary | ICD-10-CM | POA: Diagnosis not present

## 2024-06-20 DIAGNOSIS — F419 Anxiety disorder, unspecified: Secondary | ICD-10-CM | POA: Diagnosis not present

## 2024-07-13 DIAGNOSIS — F419 Anxiety disorder, unspecified: Secondary | ICD-10-CM | POA: Diagnosis not present

## 2024-08-03 DIAGNOSIS — F419 Anxiety disorder, unspecified: Secondary | ICD-10-CM | POA: Diagnosis not present

## 2024-08-10 DIAGNOSIS — Z23 Encounter for immunization: Secondary | ICD-10-CM | POA: Diagnosis not present

## 2024-08-24 DIAGNOSIS — F419 Anxiety disorder, unspecified: Secondary | ICD-10-CM | POA: Diagnosis not present

## 2024-09-13 DIAGNOSIS — F419 Anxiety disorder, unspecified: Secondary | ICD-10-CM | POA: Diagnosis not present

## 2024-09-26 ENCOUNTER — Other Ambulatory Visit: Payer: Self-pay | Admitting: Internal Medicine

## 2024-09-26 NOTE — Telephone Encounter (Signed)
 Medication:  fluticasone -salmeterol (ADVAIR) 100-50 MCG/ACT AEPB - Pt states she is leaving to [place]  on Saturday and will run about by Monday while she is on trip. She won't be back until middle of November.

## 2024-09-26 NOTE — Telephone Encounter (Signed)
 Copied from CRM [id number]. Topic: Clinical - Medication Refill >> Sep 26, 2024 10:24 AM Eva FALCON wrote: Medication:  fluticasone -salmeterol (ADVAIR) 100-50 MCG/ACT AEPB - Pt states she is leaving to [place]  on Saturday and will run about by Monday while she is on trip. She won't be back until middle of November.    Has the patient contacted their pharmacy? No, states pharmacy will call in.  (Agent: If no, request that the patient contact the pharmacy for the refill. If patient does not wish to contact the pharmacy document the reason why and proceed with request.) (Agent: If yes, when and what did the pharmacy advise?)  This is the patient's preferred pharmacy:  [facility] [place], [place] - [address] [facility] [place] [address] Phone: [phone] Fax: [phone]  Is this the correct pharmacy for this prescription? Yes If no, delete pharmacy and type the correct one.   Has the prescription been filled recently? Yes  Is the patient out of the medication? No  Has the patient been seen for an appointment in the last year OR does the patient have an upcoming appointment? Yes  Can we respond through MyChart? Yes  Agent: Please be advised that Rx refills may take up to 3 business days. We ask that you follow-up with your pharmacy.

## 2024-10-09 DIAGNOSIS — F419 Anxiety disorder, unspecified: Secondary | ICD-10-CM | POA: Diagnosis not present

## 2024-10-29 DIAGNOSIS — F419 Anxiety disorder, unspecified: Secondary | ICD-10-CM | POA: Diagnosis not present

## 2024-11-19 DIAGNOSIS — F419 Anxiety disorder, unspecified: Secondary | ICD-10-CM | POA: Diagnosis not present

## 2025-01-25 ENCOUNTER — Encounter: Admitting: Internal Medicine
